# Patient Record
Sex: Male | Born: 1990 | Race: Black or African American | Hispanic: No | Marital: Single | Smoking: Current every day smoker
Health system: Southern US, Community
[De-identification: ages and names within clinical notes are randomized; demographics above are authoritative.]

## PROBLEM LIST (undated history)

## (undated) DIAGNOSIS — F419 Anxiety disorder, unspecified: Secondary | ICD-10-CM

## (undated) HISTORY — DX: Anxiety disorder, unspecified: F41.9

---
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
## 2017-11-18 ENCOUNTER — Emergency Department (HOSPITAL_COMMUNITY)
Admission: EM | Admit: 2017-11-18 | Discharge: 2017-11-18 | Disposition: A | Discharge status: Home / Self Care | Payer: Self-pay | Attending: Emergency Medicine | Admitting: Emergency Medicine

## 2017-11-18 ENCOUNTER — Emergency Department (HOSPITAL_COMMUNITY): Payer: Self-pay

## 2017-11-18 ENCOUNTER — Encounter (HOSPITAL_COMMUNITY): Payer: Self-pay | Admitting: Emergency Medicine

## 2017-11-18 ENCOUNTER — Other Ambulatory Visit: Payer: Self-pay

## 2017-11-18 DIAGNOSIS — Y929 Unspecified place or not applicable: Secondary | ICD-10-CM | POA: Insufficient documentation

## 2017-11-18 DIAGNOSIS — Y9301 Activity, walking, marching and hiking: Secondary | ICD-10-CM | POA: Insufficient documentation

## 2017-11-18 DIAGNOSIS — S41111A Laceration without foreign body of right upper arm, initial encounter: Secondary | ICD-10-CM | POA: Insufficient documentation

## 2017-11-18 DIAGNOSIS — W228XXA Striking against or struck by other objects, initial encounter: Secondary | ICD-10-CM | POA: Insufficient documentation

## 2017-11-18 DIAGNOSIS — Y999 Unspecified external cause status: Secondary | ICD-10-CM | POA: Insufficient documentation

## 2017-11-18 MED ORDER — LIDOCAINE-EPINEPHRINE (PF) 2 %-1:200000 IJ SOLN
10.0000 mL | Freq: Once | INTRAMUSCULAR | Status: AC
  Administered 2017-11-18: 10 mL via INTRADERMAL
  Filled 2017-11-18: qty 20

## 2017-11-18 NOTE — ED Provider Notes (Signed)
[facility] EMERGENCY DEPARTMENT Provider Note   CSN: [id number] Arrival date & time: 11/18/17  2022     History   Chief Complaint Chief Complaint  Patient presents with  . Extremity Laceration    HPI Ivan Macias is a 27 y.o. male.  HPI  Patient is a 27 year old male who presents the ED today complaining of an laceration to the right antecubital area of his right upper extremity that occurred prior to arrival.  Patient states that he was walking out of a doorway prior to arrival he tripped and hit his arm on the door handle which caused a laceration to his arm. States that the door handle punctured through his arm. He reports constant severe pain to this area.  Worse with movement.  Denies any numbness or weakness to the hand or arm.  Denies any other injuries with the fall.  States that he had a tetanus shot last year.  History reviewed. No pertinent past medical history.  Patient Active Problem List   Diagnosis Date Noted  . UNSPECIFIED SITE OF ANKLE SPRAIN AND STRAIN 05/16/2010  . 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 No pertinent surgical history.      Home Medications    Prior to Admission medications   Not on File    Family History No family history on file.  Social History Social History   Tobacco Use  . Smoking status: Never Smoker  . Smokeless tobacco: Never Used  Substance Use Topics  . Alcohol use: Yes  . Drug use: Never     Allergies   Patient has no allergy information on record.   Review of Systems Review of Systems  Constitutional: Negative for fever.  Musculoskeletal:       Right arm pain  Skin: Positive for wound.  Neurological: Negative for weakness, numbness and headaches.     Physical Exam Updated Vital Signs BP (!) 127/93 (BP Location: Left Arm)   Pulse 75   Temp 98.1 F (36.7 C) (Oral)   Resp 16   SpO2 93%   Physical Exam  Constitutional: He is oriented to person, place, and time. He  appears well-developed and well-nourished. No distress.  Eyes: Conjunctivae are normal.  Cardiovascular: Normal rate, regular rhythm and normal heart sounds.  Pulmonary/Chest: Effort normal and breath sounds normal.  Abdominal: Soft. Bowel sounds are normal. There is no tenderness.  Musculoskeletal:  2.5-3cm linear laceration to the right antecubital with muscle exposure.  No obvious muscular or vascular injury identified.  Bleeding is controlled.  Patient is neurovascular intact distally with strong radial pulses bilaterally.  Brisk cap refill to all fingers.  He does have symmetric grip strength bilaterally is able to flex and extend elbows however does have significant pain to the right upper extremity with this movement.  Neurological: He is alert and oriented to person, place, and time.  Skin: Skin is warm and dry.     ED Treatments / Results  Labs (all labs ordered are listed, but only abnormal results are displayed) Labs Reviewed - No data to display  EKG None  Radiology Dg Elbow Complete Right  Result Date: 11/18/2017 CLINICAL DATA:  Laceration to the elbow EXAM: RIGHT ELBOW - COMPLETE 3+ VIEW COMPARISON:  None. FINDINGS: No fracture or dislocation. Gas in the soft tissues at the antecubital fossa and on the radial side of the elbow consistent with a history of laceration. No radiopaque foreign body IMPRESSION: No fracture or malalignment Electronically Signed  By: Jasmine Pang M.D.   On: 11/18/2017 21:44    Procedures .Marland KitchenLaceration Repair Date/Time: 11/18/2017 11:20 PM Performed by: Karrie Meres, PA-C Authorized by: Karrie Meres, PA-C   Consent:    Consent obtained:  Verbal   Consent given by:  Patient   Risks discussed:  Infection and pain   Alternatives discussed:  No treatment Anesthesia (see MAR for exact dosages):    Anesthesia method:  Local infiltration   Local anesthetic:  Lidocaine 2% WITH epi Laceration details:    Location: right antecubital  fossa.   Length (cm):  3 Repair type:    Repair type:  Simple Pre-procedure details:    Preparation:  Patient was prepped and draped in usual sterile fashion and imaging obtained to evaluate for foreign bodies Exploration:    Hemostasis achieved with:  Epinephrine   Wound exploration: wound explored through full range of motion     Contaminated: no   Treatment:    Area cleansed with:  Saline   Amount of cleaning:  Extensive   Irrigation solution:  Sterile saline   Irrigation volume:  1L   Irrigation method:  Pressure wash   Visualized foreign bodies/material removed: no   Skin repair:    Repair method:  Sutures   Suture size:  5-0   Suture material:  Prolene Approximation:    Approximation:  Loose Post-procedure details:    Dressing:  Antibiotic ointment and non-adherent dressing   Patient tolerance of procedure:  Tolerated well, no immediate complications   (including critical care time)  Medications Ordered in ED Medications  lidocaine-EPINEPHrine (XYLOCAINE W/EPI) 2 %-1:200000 (PF) injection 10 mL (10 mLs Intradermal Given 11/18/17 2200)     Initial Impression / Assessment and Plan / ED Course  I have reviewed the triage vital signs and the nursing notes.  Pertinent labs & imaging results that were available during my care of the patient were reviewed by me and considered in my medical decision making (see chart for details).  Final Clinical Impressions(s) / ED Diagnoses   Final diagnoses:  Laceration of right upper extremity, initial encounter   Pressure irrigation performed. Wound explored and majority of the base of wound visualized in a bloodless field without evidence of foreign body.  X-ray completed of right elbow which showed no obvious bony involvement and no obvious foreign body.  Laceration occurred < 8 hours prior to repair which was well tolerated.  Tdap UTD.  Pt has no comorbidities to effect normal wound healing. Pt discharged without antibiotics.   Discussed suture home care with patient and answered questions. Pt to follow-up for wound check and suture removal in 10 days; they are to return to the ED sooner for signs of infection. Pt is hemodynamically stable with no complaints prior to dc.   ED Discharge Orders    None       Rayne Du 11/18/17 2321    Vanetta Mulders, MD 11/22/17 [phone]

## 2017-11-18 NOTE — ED Provider Notes (Signed)
Medical screening examination/treatment/procedure(s) were conducted as a shared visit with non-physician practitioner(s) and myself.  I personally evaluated the patient during the encounter.  None   Results for orders placed or performed during the hospital encounter of 17/40/81  Basic metabolic panel  Result Value Ref Range   Sodium 138 135 - 145 mEq/L   Potassium 3.7 3.5 - 5.1 mEq/L   Chloride 104 96 - 112 mEq/L   CO2 28 19 - 32 mEq/L   Glucose, Bld 74 70 - 99 mg/dL   BUN 12 6 - 23 mg/dL   Creatinine, Ser 1.42 0.4 - 1.5 mg/dL   Calcium 9.2 8.4 - 10.5 mg/dL   GFR calc non Af Amer NOT CALCULATED >60 mL/min   GFR calc Af Amer  >60 mL/min    NOT CALCULATED        The eGFR has been calculated using the MDRD equation. This calculation has not been validated in all clinical situations. eGFR's persistently <60 mL/min signify possible Chronic Kidney Disease.  CBC  Result Value Ref Range   WBC 10.4 4.5 - 13.5 K/uL   RBC 4.82 3.80 - 5.70 MIL/uL   Hemoglobin 13.4 12.0 - 16.0 g/dL   HCT 41.0 36.0 - 49.0 %   MCV 85.1 78.0 - 98.0 fL   MCH 27.8 25.0 - 34.0 pg   MCHC 32.7 31.0 - 37.0 g/dL   RDW 14.4 11.4 - 15.5 %   Platelets 260 150 - 400 K/uL  Differential  Result Value Ref Range   Neutrophils Relative % 62 43 - 71 %   Neutro Abs 6.5 1.7 - 8.0 K/uL   Lymphocytes Relative 22 (L) 24 - 48 %   Lymphs Abs 2.2 1.1 - 4.8 K/uL   Monocytes Relative 5 3 - 11 %   Monocytes Absolute 0.5 0.2 - 1.2 K/uL   Eosinophils Relative 10 (H) 0 - 5 %   Eosinophils Absolute 1.1 0.0 - 1.2 K/uL   Basophils Relative 1 0 - 1 %   Basophils Absolute 0.1 0.0 - 0.1 K/uL  D-dimer, quantitative  Result Value Ref Range   D-Dimer, Quant  0.00 - 0.48 ug/mL-FEU    0.46        AT THE INHOUSE ESTABLISHED CUTOFF VALUE OF 0.48 ug/mL FEU, THIS ASSAY HAS BEEN DOCUMENTED IN THE LITERATURE TO HAVE A SENSITIVITY AND NEGATIVE PREDICTIVE VALUE OF AT LEAST 98 TO 99%.  THE TEST RESULT SHOULD BE CORRELATED WITH AN  ASSESSMENT OF THE CLINICAL PROBABILITY OF DVT / VTE.  Cardiac markers, POC  Result Value Ref Range   Myoglobin, poc 106 12 - 200 ng/mL   CKMB, poc 4.0 1.0 - 8.0 ng/mL   Troponin i, poc <0.05 0.00 - 0.09 ng/mL   Comment             TROPONIN VALUES IN THE RANGE OF 0.00-0.09 ng/mL SHOW NO INDICATION OF MYOCARDIAL INJURY.        PERSISTENTLY INCREASED TROPONIN VALUES IN THE RANGE OF 0.10-0.24 ng/mL CAN BE SEEN IN:       -UNSTABLE ANGINA       -CONGESTIVE HEART FAILURE       -MYOCARDITIS       -CHEST TRAUMA       -ARRYHTHMIAS       -LATE PRESENTING MI       -COPD   CLINICAL FOLLOW-UP RECOMMENDED.        TROPONIN VALUES >=0.25 ng/mL INDICATE POSSIBLE MYOCARDIAL ISCHEMIA. SERIAL TESTING RECOMMENDED.   Dg  Elbow Complete Right  Result Date: 11/18/2017 CLINICAL DATA:  Laceration to the elbow EXAM: RIGHT ELBOW - COMPLETE 3+ VIEW COMPARISON:  None. FINDINGS: No fracture or dislocation. Gas in the soft tissues at the antecubital fossa and on the radial side of the elbow consistent with a history of laceration. No radiopaque foreign body IMPRESSION: No fracture or malalignment Electronically Signed   By: Donavan Foil M.D.   On: 11/18/2017 21:44     Patient with a stab wound from a door handle to his right antecubital area.  Occurred probably around 7 this evening.  Patient's tetanus up-to-date a tetanus a year ago.  Wound is about a dime shaped spherical opening at the antecubital area it is through the subcu fat there is some muscle exposure.  No significant bleeding.  Radial pulse distally is intact good finger cap refill and sensation to fingers movement fingers is all normal.  Wound can be closed either by suturing or with staples.  And then wound care.   Fredia Sorrow, MD 11/18/17 2236

## 2017-11-18 NOTE — ED Triage Notes (Signed)
Pt was leaving his mom's house, fell through the screen door and the doorknob punctured/lacerated his left arm.  Bleeding controlled. Dressing in place. Can move arm and fingers but is just painful to do so.

## 2017-11-18 NOTE — Discharge Instructions (Signed)
You may take Tylenol and ibuprofen for your pain.  Please follow the attached instructions for proper wound care at home.  Please refrain from working or lifting heavy objects for the next several days until your pain has improved.  Please follow-up in 10 days for suture removal. Please return to the emergency room immediately if you experience any new or worsening symptoms or any symptoms that indicate worsening infection such as fevers, increased redness/swelling/pain, warmth, or drainage from the affected area.

## 2019-05-15 IMAGING — CR DG ELBOW COMPLETE 3+V*R*
4 series · 4 of 4 positions shown · non-contrast
Comparison: None.

CLINICAL DATA: Laceration to the elbow

EXAM:
RIGHT ELBOW - COMPLETE 3+ VIEW

[elbow ap]
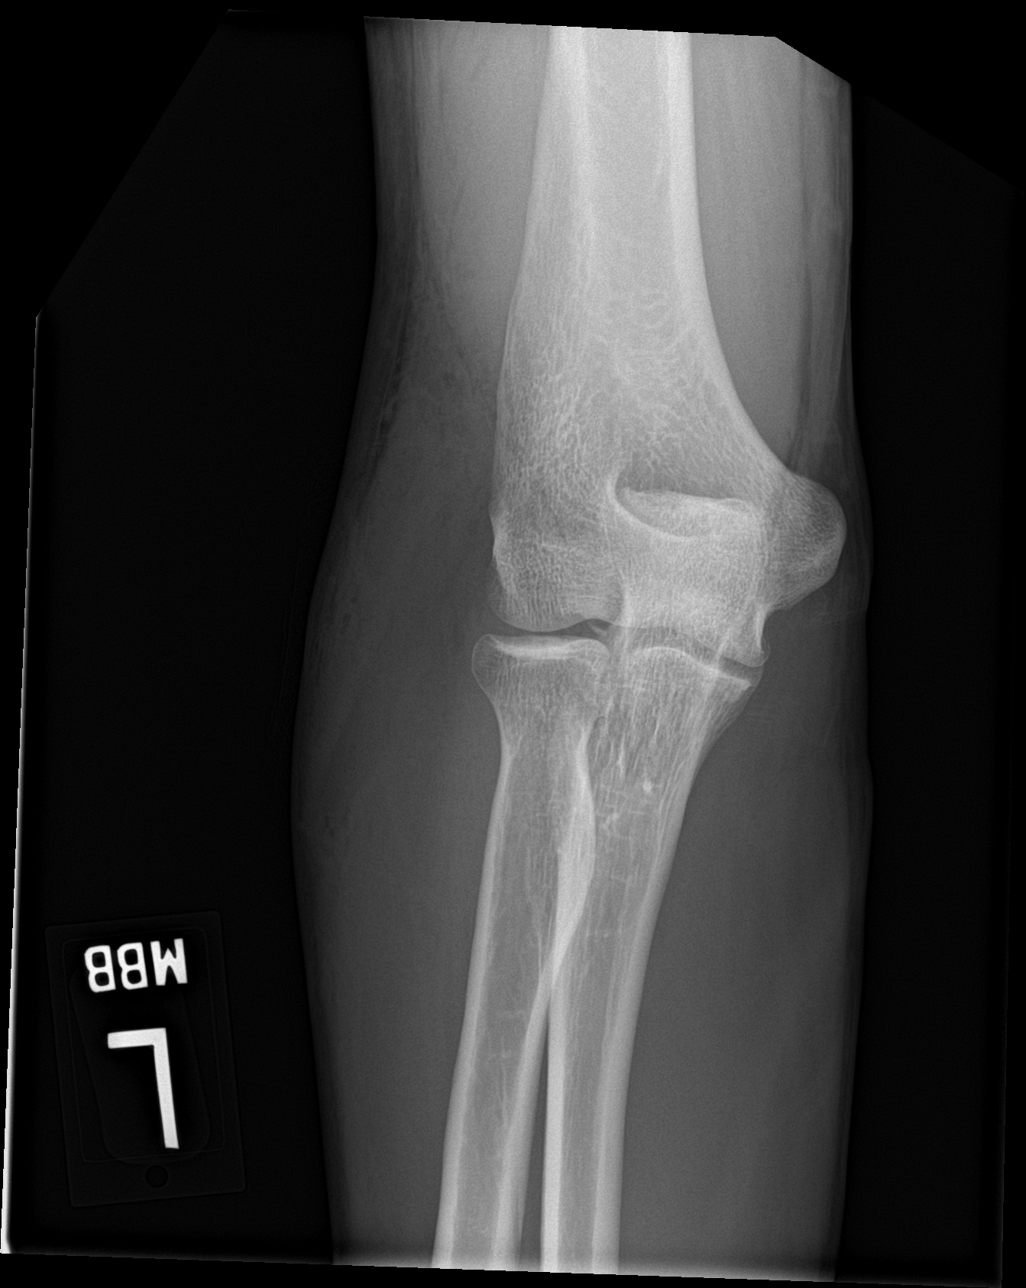

[elbow obl (1 of 2)]
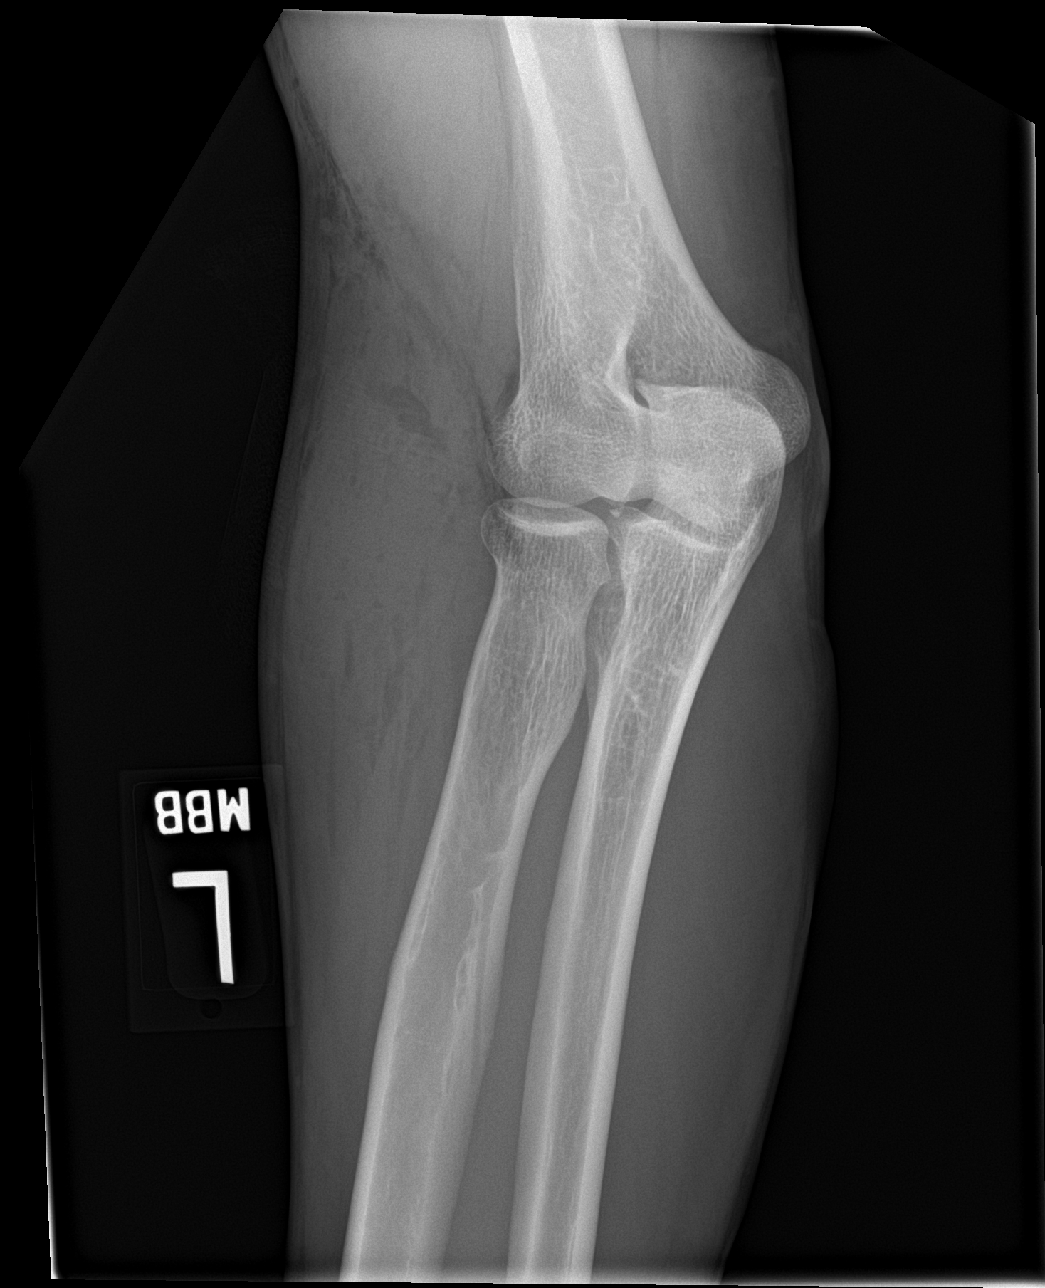

[elbow obl (2 of 2)]
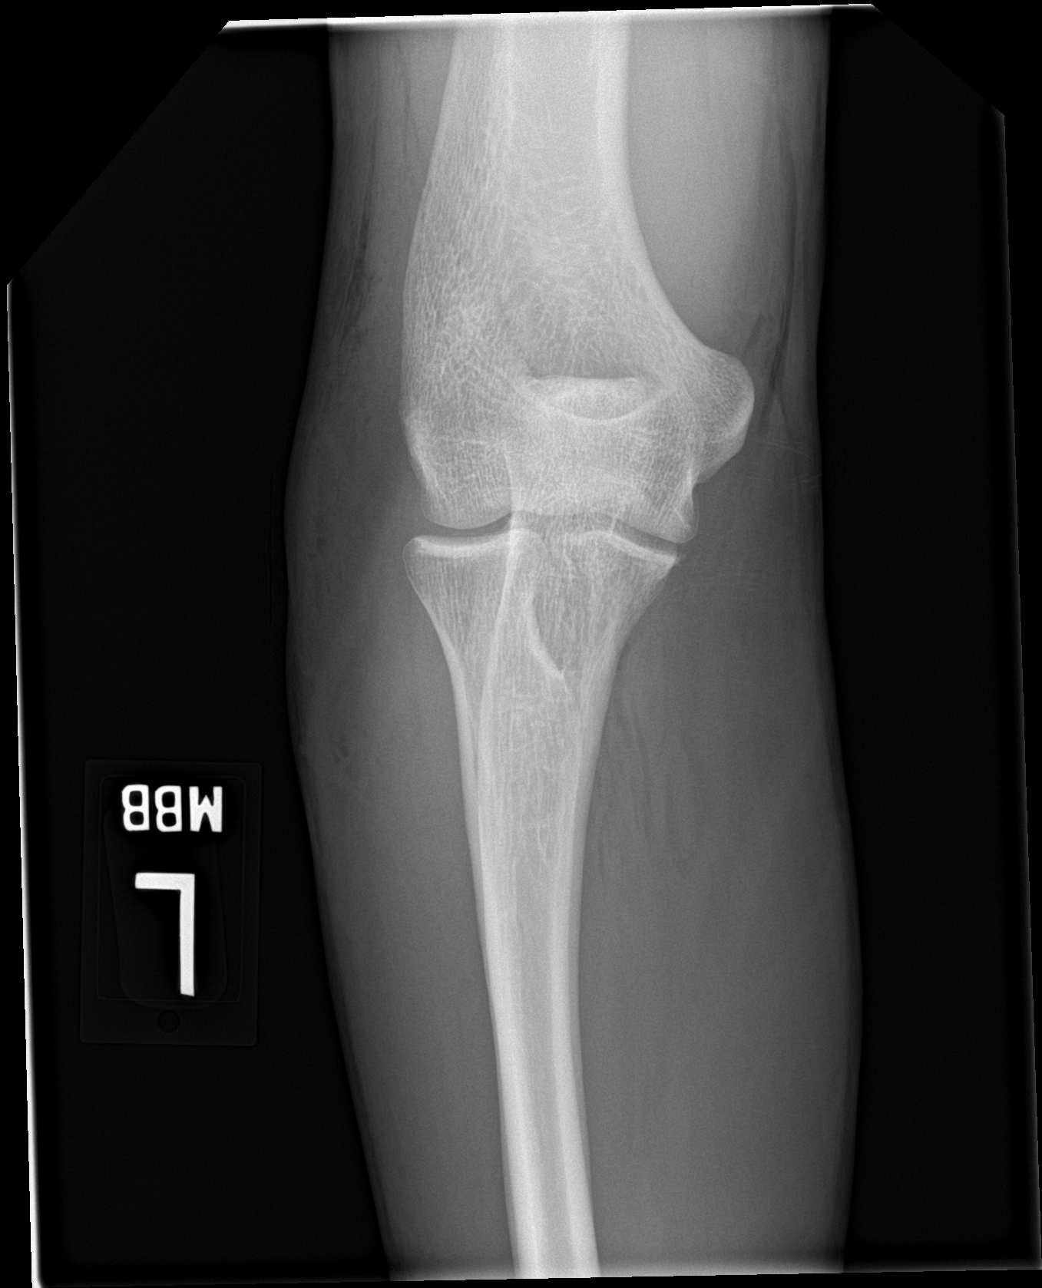

[elbow lat]
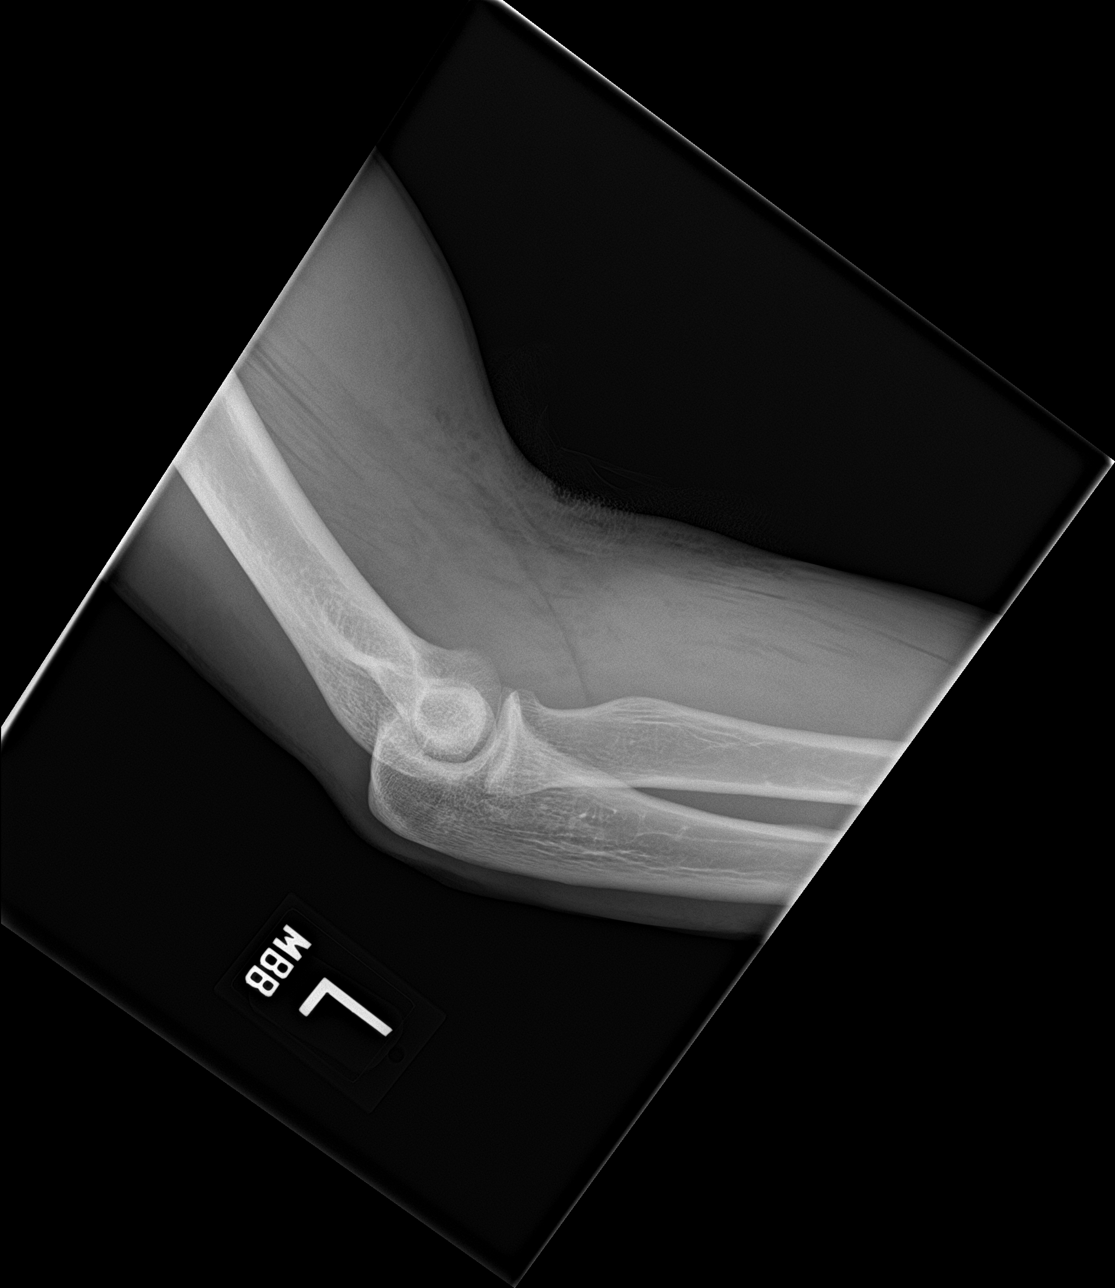

[4 of 4 positions shown; findings below may reference images not displayed]

FINDINGS: No fracture or dislocation. Gas in the soft tissues at the
antecubital fossa and on the radial side of the elbow consistent
with a history of laceration. No radiopaque foreign body
IMPRESSION: No fracture or malalignment

## 2019-12-01 DIAGNOSIS — R112 Nausea with vomiting, unspecified: Secondary | ICD-10-CM | POA: Diagnosis not present

## 2019-12-01 DIAGNOSIS — Z6824 Body mass index (BMI) 24.0-24.9, adult: Secondary | ICD-10-CM | POA: Diagnosis not present

## 2019-12-01 DIAGNOSIS — R109 Unspecified abdominal pain: Secondary | ICD-10-CM | POA: Diagnosis not present

## 2019-12-01 DIAGNOSIS — L0291 Cutaneous abscess, unspecified: Secondary | ICD-10-CM | POA: Diagnosis not present

## 2019-12-20 DIAGNOSIS — N50811 Right testicular pain: Secondary | ICD-10-CM | POA: Diagnosis not present

## 2019-12-20 DIAGNOSIS — N50812 Left testicular pain: Secondary | ICD-10-CM | POA: Diagnosis not present

## 2019-12-20 DIAGNOSIS — R1030 Lower abdominal pain, unspecified: Secondary | ICD-10-CM | POA: Diagnosis not present

## 2019-12-20 DIAGNOSIS — Z6824 Body mass index (BMI) 24.0-24.9, adult: Secondary | ICD-10-CM | POA: Diagnosis not present

## 2019-12-26 ENCOUNTER — Other Ambulatory Visit: Payer: Self-pay

## 2019-12-26 ENCOUNTER — Encounter (HOSPITAL_COMMUNITY): Payer: Self-pay

## 2019-12-26 ENCOUNTER — Emergency Department (HOSPITAL_COMMUNITY)
Admission: EM | Admit: 2019-12-26 | Discharge: 2019-12-26 | Disposition: A | Discharge status: Home / Self Care | Payer: BC Managed Care – PPO | Attending: Emergency Medicine | Admitting: Emergency Medicine

## 2019-12-26 DIAGNOSIS — R195 Other fecal abnormalities: Secondary | ICD-10-CM

## 2019-12-26 DIAGNOSIS — K5909 Other constipation: Secondary | ICD-10-CM | POA: Insufficient documentation

## 2019-12-26 DIAGNOSIS — R1013 Epigastric pain: Secondary | ICD-10-CM | POA: Insufficient documentation

## 2019-12-26 DIAGNOSIS — K5900 Constipation, unspecified: Secondary | ICD-10-CM | POA: Diagnosis not present

## 2019-12-26 DIAGNOSIS — R319 Hematuria, unspecified: Secondary | ICD-10-CM | POA: Diagnosis not present

## 2019-12-26 DIAGNOSIS — K921 Melena: Secondary | ICD-10-CM | POA: Insufficient documentation

## 2019-12-26 LAB — COMPREHENSIVE METABOLIC PANEL
ALT: 27 U/L (ref 0–44)
AST: 39 U/L (ref 15–41)
Albumin: 3.9 g/dL (ref 3.5–5.0)
Alkaline Phosphatase: 77 U/L (ref 38–126)
Anion gap: 12 (ref 5–15)
BUN: <5 mg/dL — ABNORMAL LOW (ref 6–20)
CO2: 24 mmol/L (ref 22–32)
Calcium: 9.3 mg/dL (ref 8.9–10.3)
Chloride: 103 mmol/L (ref 98–111)
Creatinine, Ser: 1.14 mg/dL (ref 0.61–1.24)
GFR calc Af Amer: >60 mL/min (ref >60)
GFR calc non Af Amer: >60 mL/min (ref >60)
Glucose, Bld: 87 mg/dL (ref 70–99)
Potassium: 3.9 mmol/L (ref 3.5–5.1)
Sodium: 139 mmol/L (ref 135–145)
Total Bilirubin: 0.8 mg/dL (ref 0.3–1.2)
Total Protein: 7.1 g/dL (ref 6.5–8.1)

## 2019-12-26 LAB — URINALYSIS, ROUTINE W REFLEX MICROSCOPIC
Bacteria, UA: NONE SEEN
Bilirubin Urine: NEGATIVE
Glucose, UA: NEGATIVE mg/dL
Ketones, ur: NEGATIVE mg/dL
Leukocytes,Ua: NEGATIVE
Nitrite: NEGATIVE
Protein, ur: NEGATIVE mg/dL
Specific Gravity, Urine: 1.021 (ref 1.005–1.030)
pH: 5 (ref 5.0–8.0)

## 2019-12-26 LAB — CBC
HCT: 44.7 % (ref 39.0–52.0)
Hemoglobin: 14.5 g/dL (ref 13.0–17.0)
MCH: 29.8 pg (ref 26.0–34.0)
MCHC: 32.4 g/dL (ref 30.0–36.0)
MCV: 92 fL (ref 80.0–100.0)
Platelets: 328 10*3/uL (ref 150–400)
RBC: 4.86 MIL/uL (ref 4.22–5.81)
RDW: 15.2 % (ref 11.5–15.5)
WBC: 13.1 10*3/uL — ABNORMAL HIGH (ref 4.0–10.5)
nRBC: 0 % (ref 0.0–0.2)

## 2019-12-26 LAB — POC OCCULT BLOOD, ED: Fecal Occult Bld: POSITIVE — AB

## 2019-12-26 LAB — LIPASE, BLOOD: Lipase: 28 U/L (ref 11–51)

## 2019-12-26 MED ORDER — DICYCLOMINE HCL 20 MG PO TABS: 20.0000 mg | ORAL_TABLET | Freq: Two times a day (BID) | ORAL | 0 refills | Status: DC

## 2019-12-26 MED ORDER — OMEPRAZOLE 20 MG PO CPDR: 20.0000 mg | DELAYED_RELEASE_CAPSULE | Freq: Every day | ORAL | 0 refills | Status: DC

## 2019-12-26 NOTE — ED Triage Notes (Signed)
Pt reports generalized abd pain for the past 2 weeks, pt went to UC when it started, given abx for a UTI in which he has finished. Pt last BM was about an hour ago but states he has had a harder time moving his bowels. Denies n/v

## 2019-12-26 NOTE — Discharge Instructions (Addendum)
Your evaluation today was reassuring.  You do have evidence of a small amount of blood in your stool which could be either from a gastric ulcer which can sometimes result from ibuprofen use.  Please stop using ibuprofen for right now and you may use Tylenol for pain 1000 g every 6 hours.  I have also prescribed you medication called Prilosec please take daily for the next 2 weeks.  He also had some blood in urine which is, as we discussed, concerning.  You should follow-up with your primary care doctor however I given you a urology consult if you are unable to see a PCP in the next several weeks.  Please call to make an appointment with a primary care doctor.  Because you have had blood in her urine for several weeks now I am recommending a urologist.  Please follow-up by calling their office for a scheduled appointment.  I also provided you with a gastroenterology referral.  For medications I am prescribing you Bentyl which can help with abdominal pain.  I am also prescribing you a "GI cleanout "which she will take that can help with constipation.      Cleanout:  Miralax cleanout 5 capfuls in 24-36 oz gatorade, drink over 4-6 hrs. If stool is not clear after 24 hours, then repeat this dose for a second day.   After Cleanout:  Give Miralax 1 capful in 8 oz juice or gatorade daily for at least 4-6 weeks.  Schedule follow up with pediatrician if no improvement in constipation in 1-2 months, sooner if not resolved after cleanout.

## 2019-12-26 NOTE — ED Provider Notes (Signed)
[facility] EMERGENCY DEPARTMENT Provider Note   CSN: [id number] Arrival date & time: 12/26/19  1106     History Chief Complaint  Patient presents with  . Abdominal Pain    Ivan Macias is a 29 y.o. male.  HPI  Patient is a 29 year old male with no pertinent past medical history presented today with epigastric abdominal pain which he states is intermittent and mild.  He states is been ongoing for approximately 2 weeks.  He also states that he feels a fullness in his lower abdomen when he poops.  He states he has been having smaller groups recently that have been small and hard.  He denies any hematochezia but does endorse some occasional darker appearing stool.  He states that he was seen 5/31 and diagnosed with a abscess and told to take ibuprofen.  He is taking that regularly for some time before his epigastric abdominal pain began.  He denies any nausea or vomiting or diarrhea.  He denies any fevers or chills.  He describes the abdominal pain as achy and worse with eating but states that it is not prevented him from eating.  He states he has a normal appetite.  He also states that he was treated for UTI 2 weeks ago however he states he had no symptoms of UTI at that time and clarifies that he was found to have some blood in his urine.    History reviewed. No pertinent past medical history.  Patient Active Problem List   Diagnosis Date Noted  . UNSPECIFIED SITE OF ANKLE SPRAIN AND STRAIN 05/16/2010  . 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 No pertinent surgical history.     No family history on file.  Social History   Tobacco Use  . Smoking status: Never Smoker  . Smokeless tobacco: Never Used  Substance Use Topics  . Alcohol use: Yes  . Drug use: Never    Home Medications Prior to Admission medications   Medication Sig Start Date End Date Taking? Authorizing Provider  dicyclomine (BENTYL) 20 MG tablet Take 1 tablet (20 mg total) by  mouth 2 (two) times daily. 12/26/19   Tedd Sias, PA  omeprazole (PRILOSEC) 20 MG capsule Take 1 capsule (20 mg total) by mouth daily for 14 days. 12/26/19 01/09/20  Tedd Sias, PA    Allergies    Patient has no known allergies.  Review of Systems   Review of Systems  Constitutional: Negative for chills and fever.  HENT: Negative for congestion.   Eyes: Negative for pain.  Respiratory: Negative for cough and shortness of breath.   Cardiovascular: Negative for chest pain and leg swelling.  Gastrointestinal: Positive for abdominal pain and constipation. Negative for nausea and vomiting.  Genitourinary: Positive for hematuria. Negative for dysuria.  Musculoskeletal: Negative for myalgias.  Skin: Negative for rash.  Neurological: Negative for dizziness and headaches.    Physical Exam Updated Vital Signs BP 116/75   Pulse (!) 53   Temp 98.3 F (36.8 C) (Oral)   Resp 18   Ht 6' (1.829 m)   Wt 83 kg   SpO2 96%   BMI 24.82 kg/m   Physical Exam Vitals and nursing note reviewed. Exam conducted with a chaperone present.  Constitutional:      General: He is not in acute distress.    Comments: Pleasant, well-appearing 29 year old gentleman in no acute distress.  Sitting up in exam bed.  HENT:     Head: Normocephalic and  atraumatic.     Nose: Nose normal.  Eyes:     General: No scleral icterus. Cardiovascular:     Rate and Rhythm: Normal rate and regular rhythm.     Pulses: Normal pulses.     Heart sounds: Normal heart sounds.  Pulmonary:     Effort: Pulmonary effort is normal. No respiratory distress.     Breath sounds: No wheezing.  Abdominal:     Palpations: Abdomen is soft.     Tenderness: There is no abdominal tenderness.     Comments: No significant abdominal tenderness.  No CVA tenderness.  No guarding or rebound.  Negative Eulah Pont, McBurney, Rovsing sign.  Genitourinary:    Rectum: Normal. Guaiac result positive.     Comments: Scant dark stool that is  nontarry on exam.  No palpable hemorrhoids or abscesses. Musculoskeletal:     Cervical back: Normal range of motion.     Right lower leg: No edema.     Left lower leg: No edema.  Skin:    General: Skin is warm and dry.     Capillary Refill: Capillary refill takes less than 2 seconds.  Neurological:     Mental Status: He is alert. Mental status is at baseline.  Psychiatric:        Mood and Affect: Mood normal.        Behavior: Behavior normal.     ED Results / Procedures / Treatments   Labs (all labs ordered are listed, but only abnormal results are displayed) Labs Reviewed  COMPREHENSIVE METABOLIC PANEL - Abnormal; Notable for the following components:      Result Value   BUN <5 (*)    All other components within normal limits  CBC - Abnormal; Notable for the following components:   WBC 13.1 (*)    All other components within normal limits  URINALYSIS, ROUTINE W REFLEX MICROSCOPIC - Abnormal; Notable for the following components:   Hgb urine dipstick SMALL (*)    All other components within normal limits  POC OCCULT BLOOD, ED - Abnormal; Notable for the following components:   Fecal Occult Bld POSITIVE (*)    All other components within normal limits  LIPASE, BLOOD  OCCULT BLOOD X 1 CARD TO LAB, STOOL    EKG None  Radiology No results found.  Procedures Procedures (including critical care time)  Medications Ordered in ED Medications - No data to display  ED Course  I have reviewed the triage vital signs and the nursing notes.  Pertinent labs & imaging results that were available during my care of the patient were reviewed by me and considered in my medical decision making (see chart for details).    MDM Rules/Calculators/A&P                          Patient is well-appearing 29 year old male with no pertinent past medical history presented today with abdominal pain, complaints of hard pieces of stool and abdominal fullness with defecation.  Patient is  well-appearing physical exam has vital signs within normal limits and has no tenderness with palpation of the abdomen.   Given patient's abnormal stools I have moderate concern for constipation versus colon cancer which I have a low suspicion for as patient denies any family history.  He does state that he has some epigastric pain and has noted some darker stools and has a positive guaiac which makes me more suspicious for peptic ulcer.  Will treat empirically with  Prilosec and have patient follow-up with gastroenterology.  Also provided the incident with Bentyl as he has had some abdominal cramps and also provided patient with instructions for GI cleanout.  He will use MiraLAX for this.  Patient has CBC with very mild leukocytosis at 13.1 he has no infectious symptoms no fevers, nausea, vomiting.  Lipase is within normal limits doubt pancreatitis and CMP is without any electrolyte abnormalities urinalysis has some hematuria.  He states he has had this in the past and been treated for UTI although he had no other symptoms of this.  I discussed with patient my concern that he most likely has some constipation perhaps a stomach ulcer and certainly needs PCP follow-up.  He is not currently have a PCP is unsure when he will be able to be seen might want however he states he has insurance.  I recommend following up with PCP however I gave him a urologist referral for his hematuria and gastroenterologist for his positive fecal occult stool and epigastric pain as well as appear treatment for stomach ulcer.  He understands plan at discharge and will follow up with his specialists as well as a PCP.  Final Clinical Impression(s) / ED Diagnoses Final diagnoses:  Epigastric pain  Positive occult stool blood test  Hematuria, unspecified type  Other constipation    Rx / DC Orders ED Discharge Orders         Ordered    omeprazole (PRILOSEC) 20 MG capsule  Daily     Discontinue  Reprint     12/26/19 1930     dicyclomine (BENTYL) 20 MG tablet  2 times daily     Discontinue  Reprint     12/26/19 1930           Gailen Shelter, [place] 12/26/19 2228    Benjiman Core, MD 12/26/19 2318

## 2020-01-16 ENCOUNTER — Encounter: Payer: Self-pay | Admitting: Gastroenterology

## 2020-01-20 DIAGNOSIS — N50812 Left testicular pain: Secondary | ICD-10-CM | POA: Diagnosis not present

## 2020-01-27 DIAGNOSIS — I861 Scrotal varices: Secondary | ICD-10-CM | POA: Diagnosis not present

## 2020-01-27 DIAGNOSIS — N50812 Left testicular pain: Secondary | ICD-10-CM | POA: Diagnosis not present

## 2020-01-28 DIAGNOSIS — R202 Paresthesia of skin: Secondary | ICD-10-CM | POA: Diagnosis not present

## 2020-01-28 DIAGNOSIS — M79602 Pain in left arm: Secondary | ICD-10-CM | POA: Diagnosis not present

## 2020-01-28 DIAGNOSIS — M79601 Pain in right arm: Secondary | ICD-10-CM | POA: Diagnosis not present

## 2020-01-28 DIAGNOSIS — N3000 Acute cystitis without hematuria: Secondary | ICD-10-CM | POA: Diagnosis not present

## 2020-01-28 DIAGNOSIS — R200 Anesthesia of skin: Secondary | ICD-10-CM | POA: Diagnosis not present

## 2020-02-12 DIAGNOSIS — N451 Epididymitis: Secondary | ICD-10-CM | POA: Diagnosis not present

## 2020-02-12 DIAGNOSIS — R8279 Other abnormal findings on microbiological examination of urine: Secondary | ICD-10-CM | POA: Diagnosis not present

## 2020-03-17 ENCOUNTER — Ambulatory Visit: Payer: BC Managed Care – PPO | Admitting: Gastroenterology

## 2020-03-17 DIAGNOSIS — R8279 Other abnormal findings on microbiological examination of urine: Secondary | ICD-10-CM | POA: Diagnosis not present

## 2020-03-17 DIAGNOSIS — N50812 Left testicular pain: Secondary | ICD-10-CM | POA: Diagnosis not present

## 2020-04-16 DIAGNOSIS — R3121 Asymptomatic microscopic hematuria: Secondary | ICD-10-CM | POA: Diagnosis not present

## 2020-04-16 DIAGNOSIS — N50811 Right testicular pain: Secondary | ICD-10-CM | POA: Diagnosis not present

## 2020-04-16 DIAGNOSIS — N50812 Left testicular pain: Secondary | ICD-10-CM | POA: Diagnosis not present

## 2020-04-21 DIAGNOSIS — R531 Weakness: Secondary | ICD-10-CM | POA: Diagnosis not present

## 2020-04-21 DIAGNOSIS — M5416 Radiculopathy, lumbar region: Secondary | ICD-10-CM | POA: Diagnosis not present

## 2020-04-21 DIAGNOSIS — M545 Low back pain, unspecified: Secondary | ICD-10-CM | POA: Diagnosis not present

## 2020-04-21 DIAGNOSIS — F17210 Nicotine dependence, cigarettes, uncomplicated: Secondary | ICD-10-CM | POA: Diagnosis not present

## 2020-04-21 DIAGNOSIS — M47817 Spondylosis without myelopathy or radiculopathy, lumbosacral region: Secondary | ICD-10-CM | POA: Diagnosis not present

## 2020-04-21 DIAGNOSIS — M79601 Pain in right arm: Secondary | ICD-10-CM | POA: Diagnosis not present

## 2020-04-26 DIAGNOSIS — M549 Dorsalgia, unspecified: Secondary | ICD-10-CM | POA: Diagnosis not present

## 2020-04-30 DIAGNOSIS — R3121 Asymptomatic microscopic hematuria: Secondary | ICD-10-CM | POA: Diagnosis not present

## 2020-05-07 DIAGNOSIS — R3121 Asymptomatic microscopic hematuria: Secondary | ICD-10-CM | POA: Diagnosis not present

## 2020-05-07 DIAGNOSIS — N50812 Left testicular pain: Secondary | ICD-10-CM | POA: Diagnosis not present

## 2020-05-13 ENCOUNTER — Encounter (HOSPITAL_COMMUNITY): Payer: Self-pay | Admitting: Emergency Medicine

## 2020-05-13 ENCOUNTER — Other Ambulatory Visit: Payer: Self-pay

## 2020-05-13 ENCOUNTER — Emergency Department (HOSPITAL_COMMUNITY): Payer: BC Managed Care – PPO

## 2020-05-13 ENCOUNTER — Emergency Department (HOSPITAL_COMMUNITY)
Admission: EM | Admit: 2020-05-13 | Discharge: 2020-05-14 | Disposition: A | Discharge status: Home / Self Care | Payer: BC Managed Care – PPO | Attending: Emergency Medicine | Admitting: Emergency Medicine

## 2020-05-13 DIAGNOSIS — R072 Precordial pain: Secondary | ICD-10-CM | POA: Insufficient documentation

## 2020-05-13 DIAGNOSIS — R1310 Dysphagia, unspecified: Secondary | ICD-10-CM | POA: Diagnosis not present

## 2020-05-13 DIAGNOSIS — R0602 Shortness of breath: Secondary | ICD-10-CM | POA: Diagnosis not present

## 2020-05-13 DIAGNOSIS — R079 Chest pain, unspecified: Secondary | ICD-10-CM | POA: Diagnosis not present

## 2020-05-13 DIAGNOSIS — T7840XA Allergy, unspecified, initial encounter: Secondary | ICD-10-CM | POA: Insufficient documentation

## 2020-05-13 DIAGNOSIS — F17210 Nicotine dependence, cigarettes, uncomplicated: Secondary | ICD-10-CM | POA: Diagnosis not present

## 2020-05-13 LAB — CBC
HCT: 43.6 % (ref 39.0–52.0)
Hemoglobin: 14.3 g/dL (ref 13.0–17.0)
MCH: 29.1 pg (ref 26.0–34.0)
MCHC: 32.8 g/dL (ref 30.0–36.0)
MCV: 88.6 fL (ref 80.0–100.0)
Platelets: 231 10*3/uL (ref 150–400)
RBC: 4.92 MIL/uL (ref 4.22–5.81)
RDW: 13.1 % (ref 11.5–15.5)
WBC: 14.8 10*3/uL — ABNORMAL HIGH (ref 4.0–10.5)
nRBC: 0 % (ref 0.0–0.2)

## 2020-05-13 MED ORDER — HYDROCODONE-ACETAMINOPHEN 5-325 MG PO TABS
1.0000 | ORAL_TABLET | Freq: Once | ORAL | Status: AC
  Administered 2020-05-14: 1 via ORAL
  Filled 2020-05-13: qty 1

## 2020-05-13 NOTE — ED Triage Notes (Signed)
Per [facility] pt reports he smoked marijuana after leaving work; he then began having right-sided chest wall pain and reports pain all over his body and states he is unable to walk

## 2020-05-14 LAB — RAPID URINE DRUG SCREEN, HOSP PERFORMED
Amphetamines: NOT DETECTED
Barbiturates: NOT DETECTED
Benzodiazepines: NOT DETECTED
Cocaine: NOT DETECTED
Opiates: NOT DETECTED
Tetrahydrocannabinol: POSITIVE — AB

## 2020-05-14 LAB — BASIC METABOLIC PANEL
Anion gap: 11 (ref 5–15)
BUN: 15 mg/dL (ref 6–20)
CO2: 23 mmol/L (ref 22–32)
Calcium: 8.9 mg/dL (ref 8.9–10.3)
Chloride: 99 mmol/L (ref 98–111)
Creatinine, Ser: 1.24 mg/dL (ref 0.61–1.24)
GFR, Estimated: >60 mL/min (ref >60)
Glucose, Bld: 96 mg/dL (ref 70–99)
Potassium: 3.4 mmol/L — ABNORMAL LOW (ref 3.5–5.1)
Sodium: 133 mmol/L — ABNORMAL LOW (ref 135–145)

## 2020-05-14 LAB — CK: Total CK: 489 U/L — ABNORMAL HIGH (ref 49–397)

## 2020-05-14 LAB — TROPONIN I (HIGH SENSITIVITY)
Troponin I (High Sensitivity): 16 ng/L (ref <18)
Troponin I (High Sensitivity): 16 ng/L (ref <18)

## 2020-05-14 NOTE — ED Provider Notes (Signed)
[facility] EMERGENCY DEPARTMENT Provider Note   CSN: [id number] Arrival date & time: 05/13/20  2028     History Chief Complaint  Patient presents with  . Chest Pain    Ivan Macias is a 29 y.o. male.  The history is provided by the patient.  Chest Pain Pain location:  Substernal area Pain quality: pressure   Pain radiates to:  Does not radiate Pain severity:  Severe Onset quality:  Sudden Duration:  4 hours Timing:  Constant Progression:  Improving Chronicity:  New Context: drug use   Relieved by:  None tried Worsened by:  Nothing Associated symptoms: dysphagia and shortness of breath   Associated symptoms: no fever and no vomiting   Patient reports after work he smoked a marijuana joint.  Approximately 15 minutes later he began having chest pressure and tightness with associated shortness of breath.  He also reports he felt that his throat was closing off.  No rash.  No facial swelling.  The chest symptoms are improving, but still feels some difficulty swallowing. He has never had this before.  He does not think the marijuana was laced with any other drugs.  He does report he just started meloxicam for back pain. No previous history of CAD/VTE He does report diffuse myalgias.      Patient Active Problem List   Diagnosis Date Noted  . UNSPECIFIED SITE OF ANKLE SPRAIN AND STRAIN 05/16/2010  . 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 No pertinent surgical history.     History reviewed. No pertinent family history.  Social History   Tobacco Use  . Smoking status: Current Every Day Smoker    Packs/day: 0.50    Years: 10.00    Pack years: 5.00    Types: Cigarettes  . Smokeless tobacco: Never Used  Vaping Use  . Vaping Use: Former  Substance Use Topics  . Alcohol use: Yes    Alcohol/week: 2.0 - 3.0 standard drinks    Types: 2 - 3 Cans of beer per week    Comment: daily  . Drug use: Yes    Types: Marijuana    Home Medications Prior to  Admission medications   Medication Sig Start Date End Date Taking? Authorizing Provider  dicyclomine (BENTYL) 20 MG tablet Take 1 tablet (20 mg total) by mouth 2 (two) times daily. 12/26/19 05/14/20  Gailen Shelter, PA  omeprazole (PRILOSEC) 20 MG capsule Take 1 capsule (20 mg total) by mouth daily for 14 days. 12/26/19 05/14/20  Gailen Shelter, PA    Allergies    Patient has no known allergies.  Review of Systems   Review of Systems  Constitutional: Negative for fever.  HENT: Positive for trouble swallowing.   Respiratory: Positive for shortness of breath.   Cardiovascular: Positive for chest pain.  Gastrointestinal: Negative for vomiting.  Musculoskeletal: Positive for myalgias.  All other systems reviewed and are negative.   Physical Exam Updated Vital Signs BP 124/79   Pulse (!) 59   Temp 98.3 F (36.8 C) (Oral)   Resp 15   Ht 1.829 m (6')   Wt 79.4 kg   SpO2 95%   BMI 23.73 kg/m   Physical Exam CONSTITUTIONAL: Well developed/well nourished HEAD: Normocephalic/atraumatic EYES: EOMI/PERRL ENMT: Mucous membranes moist, no angioedema, uvula midline without erythema or exudates.  No edema to posterior oropharynx.  No stridor.  No dysphonia.  No drooling NECK: supple no meningeal signs SPINE/BACK:entire spine nontender CV: S1/S2 noted, no murmurs/rubs/gallops noted LUNGS:  Lungs are clear to auscultation bilaterally, no apparent distress ABDOMEN: soft, nontender, no rebound or guarding, bowel sounds noted throughout abdomen GU:no cva tenderness NEURO: Pt is awake/alert/appropriate, moves all extremitiesx4.  No facial droop.   EXTREMITIES: pulses normal/equalx4, full ROM, no calf tenderness or edema SKIN: warm, color normal, no rash PSYCH: no abnormalities of mood noted, alert and oriented to situation  ED Results / Procedures / Treatments   Labs (all labs ordered are listed, but only abnormal results are displayed) Labs Reviewed  BASIC METABOLIC PANEL - Abnormal;  Notable for the following components:      Result Value   Sodium 133 (*)    Potassium 3.4 (*)    All other components within normal limits  CBC - Abnormal; Notable for the following components:   WBC 14.8 (*)    All other components within normal limits  CK - Abnormal; Notable for the following components:   Total CK 489 (*)    All other components within normal limits  RAPID URINE DRUG SCREEN, HOSP PERFORMED - Abnormal; Notable for the following components:   Tetrahydrocannabinol POSITIVE (*)    All other components within normal limits  TROPONIN I (HIGH SENSITIVITY)  TROPONIN I (HIGH SENSITIVITY)    EKG EKG Interpretation  Date/Time:  Thursday May 13 2020 20:39:27 EST Ventricular Rate:  116 PR Interval:  160 QRS Duration: 92 QT Interval:  328 QTC Calculation: 455 R Axis:   76 Text Interpretation: Sinus tachycardia Otherwise normal ECG No STEMI Confirmed by Alona Bene [phone]) on 05/13/2020 9:14:00 PM   Radiology DG Chest 2 View  Result Date: 05/13/2020 CLINICAL DATA:  Chest pain EXAM: CHEST - 2 VIEW COMPARISON:  02/17/2010 FINDINGS: The heart size and mediastinal contours are within normal limits. Both lungs are clear. The visualized skeletal structures are unremarkable. IMPRESSION: No active cardiopulmonary disease. Electronically Signed   By: Helyn Numbers MD   On: 05/13/2020 21:03    Procedures Procedures   Medications Ordered in ED Medications  HYDROcodone-acetaminophen (NORCO/VICODIN) 5-325 MG per tablet 1 tablet (1 tablet Oral Given 05/14/20 0007)    ED Course  I have reviewed the triage vital signs and the nursing notes.  Pertinent labs & imaging results that were available during my care of the patient were reviewed by me and considered in my medical decision making (see chart for details).    MDM Rules/Calculators/A&P                          12:23 AM Patient presents with chest pain and difficulty breathing swallowing that began soon after  smoking marijuana.  Patient also reports he to start meloxicam for back pain. He was advised to stop both the medication as well as marijuana. He has no signs of anaphylaxis at this time, however symptoms do sound somewhat related to allergy. Will monitor in the ED and reassess 1:08 AM Patient resting comfortably.  No distress.  No new complaints.  Reports pain is improving.  No signs of rhabdomyolysis on labs 2:52 AM Patient reports improvement.  No acute distress.  Vitals are appropriate BP 124/80   Pulse 62   Temp 98.3 F (36.8 C) (Oral)   Resp 18   Ht 1.829 m (6')   Wt 79.4 kg   SpO2 97%   BMI 23.73 kg/m  No signs of any systemic allergic reaction.  No angioedema, no oral swelling. He reports chest symptoms have improved. Still unclear exactly what happened,  but still fever likely reaction to medication or marijuana.  He was advised to stop both.  He has used ibuprofen in the past without any difficulty, I did encourage him to use that for a short period of time.  We discussed strict ER return precautions  Low suspicion for ACS/PE/dissection at this time.  Final Clinical Impression(s) / ED Diagnoses Final diagnoses:  Precordial pain  Allergic reaction, initial encounter    Rx / DC Orders ED Discharge Orders    None       Zadie Rhine, MD 05/14/20 [phone]

## 2020-05-14 NOTE — Discharge Instructions (Addendum)
PLEASE STOP TAKING THE MELOXICAM FOR NOW   Your caregiver has diagnosed you as having chest pain that is not specific for one problem, but does not require admission.  Chest pain comes from many different causes.  SEEK IMMEDIATE MEDICAL ATTENTION IF: You have severe chest pain, especially if the pain is crushing or pressure-like and spreads to the arms, back, neck, or jaw, or if you have sweating, nausea (feeling sick to your stomach), or shortness of breath. THIS IS AN EMERGENCY. Don't wait to see if the pain will go away. Get medical help at once. Call 911 or 0 (operator). DO NOT drive yourself to the hospital.  Your chest pain gets worse and does not go away with rest.  You have an attack of chest pain lasting longer than usual, despite rest and treatment with the medications your caregiver has prescribed.  You wake from sleep with chest pain or shortness of breath.  You feel dizzy or faint.  You have chest pain not typical of your usual pain for which you originally saw your caregiver.

## 2020-05-18 ENCOUNTER — Ambulatory Visit: Payer: BC Managed Care – PPO | Admitting: Gastroenterology

## 2020-05-18 ENCOUNTER — Encounter: Payer: Self-pay | Admitting: Gastroenterology

## 2020-05-18 VITALS — BP 124/90 | HR 82 | Ht 72.0 in | Wt 175.0 lb

## 2020-05-18 DIAGNOSIS — R195 Other fecal abnormalities: Secondary | ICD-10-CM | POA: Diagnosis not present

## 2020-05-18 DIAGNOSIS — R1013 Epigastric pain: Secondary | ICD-10-CM

## 2020-05-18 NOTE — Patient Instructions (Signed)
If you are age 28 or older, your body mass index should be between 23-30. Your Body mass index is 23.73 kg/m. If this is out of the aforementioned range listed, please consider follow up with your Primary Care Provider.  If you are age 36 or younger, your body mass index should be between 19-25. Your Body mass index is 23.73 kg/m. If this is out of the aformentioned range listed, please consider follow up with your Primary Care Provider.   Follow up as needed.   It was a pleasure to see you today!  Dr. Myrtie Neither

## 2020-05-18 NOTE — Progress Notes (Signed)
[facility] Consult Note:  History: Ivan Macias 05/18/2020  Referring provider: [facility] health ED  Reason for consult/chief complaint: Blood In Stools (Positive hemocult) and Abdominal Pain (Severe left and right sided pain, recent ER visit)   Subjective  HPI:  [name] was referred to [place] after a [place] visit June 25 where he presented for several days of abdominal pain that was primarily epigastric but somewhat lower as well.  He had constipation for several days and says he had been drinking more than usual while on vacation at the beach.  ED provider note and work-up reviewed, he was found to be heme positive on rectal exam.  He tells me that he was drinking several beers a day and often skipping meals so his bowel habits were regular.  He started eating more regularly and generally taking better care of himself in the last couple of months.  He cut down his alcohol after the ED visit and then stopped altogether about a week ago.  That has all led to him feeling much better, and he has no residual abdominal pain.  He had never seen overt GI bleeding with black or bloody stool. [name] was having chronic testicular pain for which he saw urology, and tells me he had an office exam and then some scans with nothing found.  He was put on meloxicam and gabapentin.  Shortly after starting the meloxicam he was in the ED last week for an apparent allergic reaction.  He also had been using marijuana regularly including just before the reaction occurred, but did not think that was the cause.  [name]'s bowel habits are regular these days, he has no abdominal pain, his appetite is good he is eating three meals a day, working full-time and generally feeling well.  ROS:  Review of Systems  Constitutional: Negative for appetite change and unexpected weight change.  HENT: Negative for mouth sores and voice change.   Eyes: Negative for pain and redness.  Respiratory: Negative for  cough and shortness of breath.   Cardiovascular: Negative for chest pain and palpitations.  Genitourinary: Positive for testicular pain. Negative for dysuria and hematuria.  Musculoskeletal: Negative for arthralgias and myalgias.  Skin: Negative for pallor and rash.  Neurological: Negative for weakness and headaches.  Hematological: Negative for adenopathy.     Past Medical History: No past medical history on file.   Past Surgical History: No past surgical history on file.   Family History: No family history on file.  Social History: Social History   Socioeconomic History  . Marital status: Single    Spouse name: Not on file  . Number of children: Not on file  . Years of education: Not on file  . Highest education level: Not on file  Occupational History  . Not on file  Tobacco Use  . Smoking status: Current Every Day Smoker    Packs/day: 0.50    Years: 10.00    Pack years: 5.00    Types: Cigarettes  . Smokeless tobacco: Never Used  Vaping Use  . Vaping Use: Former  Substance and Sexual Activity  . Alcohol use: Yes    Alcohol/week: 2.0 - 3.0 standard drinks    Types: 2 - 3 Cans of beer per week    Comment: daily  . Drug use: Yes    Types: Marijuana  . Sexual activity: Yes  Other Topics Concern  . Not on file  Social History Narrative  . Not on file  Social Determinants of Health   Financial Resource Strain:   . Difficulty of Paying Living Expenses: Not on file  Food Insecurity:   . Worried About Programme researcher, broadcasting/film/video in the Last Year: Not on file  . Ran Out of Food in the Last Year: Not on file  Transportation Needs:   . Lack of Transportation (Medical): Not on file  . Lack of Transportation (Non-Medical): Not on file  Physical Activity:   . Days of Exercise per Week: Not on file  . Minutes of Exercise per Session: Not on file  Stress:   . Feeling of Stress : Not on file  Social Connections:   . Frequency of Communication with Friends and Family:  Not on file  . Frequency of Social Gatherings with Friends and Family: Not on file  . Attends Religious Services: Not on file  . Active Member of Clubs or Organizations: Not on file  . Attends Banker Meetings: Not on file  . Marital Status: Not on file    Allergies: Allergies  Allergen Reactions  . Meloxicam Anaphylaxis    Outpatient Meds: Current Outpatient Medications  Medication Sig Dispense Refill  . gabapentin (NEURONTIN) 100 MG capsule Take 2 capsules by mouth in the morning and at bedtime.     No current facility-administered medications for this visit.      ___________________________________________________________________ Objective   Exam:  BP 124/90   Pulse 82   Ht 6' (1.829 m)   Wt 175 lb (79.4 kg)   BMI 23.73 kg/m    General: Well-appearing  Eyes: sclera anicteric, no redness  ENT: oral mucosa moist without lesions, no cervical or supraclavicular lymphadenopathy  CV: RRR without murmur, S1/S2, no JVD, no peripheral edema  Resp: clear to auscultation bilaterally, normal RR and effort noted  GI: soft, no tenderness, with active bowel sounds. No guarding or palpable organomegaly noted.  Skin; warm and dry, no rash or jaundice noted  Neuro: awake, alert and oriented x 3. Normal gross motor function and fluent speech Rectal: Normal perianal exam.  Normal DRE, scant soft light brown stool in rectal vault, heme-negative.  Labs:  CBC Latest Ref Rng & Units 05/13/2020 12/26/2019 02/17/2010  WBC 4.0 - 10.5 K/uL 14.8(H) 13.1(H) 10.4  Hemoglobin 13.0 - 17.0 g/dL 78.2 95.6 21.3  Hematocrit 39 - 52 % 43.6 44.7 41.0  Platelets 150 - 400 K/uL 231 328 260   CMP Latest Ref Rng & Units 05/13/2020 12/26/2019 02/17/2010  Glucose 70 - 99 mg/dL 96 87 74  BUN 6 - 20 mg/dL 15 <0(Q) 12  Creatinine 0.61 - 1.24 mg/dL 6.57 8.46 9.62  Sodium 135 - 145 mmol/L 133(L) 139 138  Potassium 3.5 - 5.1 mmol/L 3.4(L) 3.9 3.7  Chloride 98 - 111 mmol/L 99 103 104    CO2 22 - 32 mmol/L 23 24 28   Calcium 8.9 - 10.3 mg/dL 8.9 9.3 9.2  Total Protein 6.5 - 8.1 g/dL - 7.1 -  Total Bilirubin 0.3 - 1.2 mg/dL - 0.8 -  Alkaline Phos 38 - 126 U/L - 77 -  AST 15 - 41 U/L - 39 -  ALT 0 - 44 U/L - 27 -     Radiologic Studies:  No abdominal imaging  Normal chest x-ray during last week's ED visit  Assessment: Encounter Diagnoses  Name Primary?  . Abdominal pain, epigastric Yes  . Heme positive stool     Epigastric pain with some lower abdominal pain occurring months ago prompting ED  visit, in setting of excess alcohol use.  There was some constipation at that time as well which since resolved.  He has lately been taking better care of himself and his symptoms have resolved, his digestive function has normalized.  Heme-negative on exam today.  I do not think he needs imaging or endoscopic tests at this point.  Plan:  I encouraged him to continue healthy diet and lifestyle and gave him our contact info so he could call as needed should recurrent GI problems arise.  Thank you for the courtesy of this consult.  Please call me with any questions or concerns.  Charlie Pitter III  CC: Referring provider noted above

## 2020-09-07 DIAGNOSIS — Z6825 Body mass index (BMI) 25.0-25.9, adult: Secondary | ICD-10-CM | POA: Diagnosis not present

## 2020-09-07 DIAGNOSIS — R1030 Lower abdominal pain, unspecified: Secondary | ICD-10-CM | POA: Diagnosis not present

## 2020-09-22 ENCOUNTER — Ambulatory Visit (INDEPENDENT_AMBULATORY_CARE_PROVIDER_SITE_OTHER): Payer: BC Managed Care – PPO | Admitting: Nurse Practitioner

## 2020-10-14 ENCOUNTER — Ambulatory Visit (INDEPENDENT_AMBULATORY_CARE_PROVIDER_SITE_OTHER): Payer: BC Managed Care – PPO | Admitting: Nurse Practitioner

## 2020-10-14 ENCOUNTER — Other Ambulatory Visit: Payer: Self-pay

## 2020-10-14 ENCOUNTER — Encounter (INDEPENDENT_AMBULATORY_CARE_PROVIDER_SITE_OTHER): Payer: Self-pay | Admitting: Nurse Practitioner

## 2020-10-14 VITALS — BP 128/66 | HR 86 | Temp 97.0°F | Ht 74.0 in | Wt 177.4 lb

## 2020-10-14 DIAGNOSIS — R519 Headache, unspecified: Secondary | ICD-10-CM

## 2020-10-14 DIAGNOSIS — Z0001 Encounter for general adult medical examination with abnormal findings: Secondary | ICD-10-CM | POA: Diagnosis not present

## 2020-10-14 DIAGNOSIS — Z1329 Encounter for screening for other suspected endocrine disorder: Secondary | ICD-10-CM | POA: Diagnosis not present

## 2020-10-14 DIAGNOSIS — Z13220 Encounter for screening for lipoid disorders: Secondary | ICD-10-CM | POA: Diagnosis not present

## 2020-10-14 DIAGNOSIS — Z131 Encounter for screening for diabetes mellitus: Secondary | ICD-10-CM

## 2020-10-14 DIAGNOSIS — J301 Allergic rhinitis due to pollen: Secondary | ICD-10-CM | POA: Diagnosis not present

## 2020-10-14 MED ORDER — FLUTICASONE PROPIONATE 50 MCG/ACT NA SUSP: 2.0000 | Freq: Every day | NASAL | 6 refills | Status: DC

## 2020-10-14 NOTE — Patient Instructions (Signed)
Find Allegra over the counter and start taking 1 tablet by mouth daily

## 2020-10-14 NOTE — Progress Notes (Signed)
Subjective:  Patient ID: Ivan Macias, male    DOB: 1991-01-29  Age: 30 y.o. MRN: [id number]  CC:  Chief Complaint  Patient presents with  . Allergic Rhinitis     Having head pain; sinuses.       HPI  This patient arrives today for the above.  He is here to establish care at this office.  He tells me he has not had a primary care provider in a very long time and he was encouraged by his mother to establish with a primary care provider.  So he is here today to establish.  He also mentions he is having a lot of the sinus congestion and head pain.  This is been going on for 2 days.  He denies any obvious fevers but has felt a couple of chills over the last couple of days.  He denies any coughing or shortness of breath.  He also mentions that he does get some frequent abdominal pain.  He tells me that he does have a bowel movement on a regular basis and does not report any constipation.  When asked if he seen blood in his stool he tells me he does not think so, but thinks maybe one time he saw a little bit of blood but is not sure about this.  No past medical history on file.    No family history on file.  Social History   Social History Narrative  . Not on file   Social History   Tobacco Use  . Smoking status: Current Every Day Smoker    Packs/day: 0.50    Years: 10.00    Pack years: 5.00    Types: Cigarettes  . Smokeless tobacco: Never Used  Substance Use Topics  . Alcohol use: Yes    Alcohol/week: 2.0 - 3.0 standard drinks    Types: 2 - 3 Cans of beer per week    Comment: daily     Current Meds  Medication Sig  . fexofenadine (ALLEGRA) 180 MG tablet Take 180 mg by mouth daily.  . fluticasone (FLONASE) 50 MCG/ACT nasal spray Place 2 sprays into both nostrils daily.    ROS:  Review of Systems  Constitutional: Positive for chills. Negative for fever.  HENT: Positive for congestion.   Respiratory: Negative for shortness of breath.   Cardiovascular:  Negative for chest pain.  Gastrointestinal: Positive for abdominal pain. Negative for blood in stool and melena.  Neurological: Positive for headaches.     Objective:   Today's Vitals: BP 128/66 (BP Location: Right Arm, Patient Position: Sitting, Cuff Size: Normal)   Pulse 86   Temp (!) 97 F (36.1 C) (Temporal)   Ht 6' 2"  (1.88 m)   Wt 177 lb 6.4 oz (80.5 kg)   BMI 22.78 kg/m  Vitals with BMI 10/14/2020 05/18/2020 05/14/2020  Height 6' 2"  6' 0"  -  Weight 177 lbs 6 oz 175 lbs -  BMI 85.63 14.97 -  Systolic 026 378 588  Diastolic 66 90 67  Pulse 86 82 62     Physical Exam Vitals reviewed.  Constitutional:      Appearance: Normal appearance.  HENT:     Head: Normocephalic and atraumatic.  Cardiovascular:     Rate and Rhythm: Normal rate and regular rhythm.  Pulmonary:     Effort: Pulmonary effort is normal.     Breath sounds: Normal breath sounds.  Abdominal:     General: Bowel sounds are normal.  Tenderness: There is no abdominal tenderness.  Musculoskeletal:     Cervical back: Neck supple.  Skin:    General: Skin is warm and dry.  Neurological:     Mental Status: He is alert and oriented to person, place, and time.  Psychiatric:        Mood and Affect: Mood normal.        Behavior: Behavior normal.        Thought Content: Thought content normal.        Judgment: Judgment normal.          Assessment and Plan   1. Allergic rhinitis due to pollen, unspecified seasonality   2. Screening for diabetes mellitus   3. Screening, lipid   4. Screening for thyroid disorder   5. Intractable headache, unspecified chronicity pattern, unspecified headache type   6. Encounter for general adult medical examination with abnormal findings      Plan: 1.  For now we will treat his symptoms I recommended he get Allegra over-the-counter and take 1 tablet by mouth daily as well as Flonase nasal spray and I recommend he administer 2 sprays in each nare daily.  He was  told that if his symptoms persist into next week that he should notify us and we may consider antibiotic therapy if needed. 2.-6.  We will collect blood work for further evaluation.  If any anemia is noted will recommend referral to GI to rule out gastrointestinal bleeding as a source for anemia.  He was also told if you notices any obvious bright red blood or black tarry stool that he should notify us for further evaluation.   Tests ordered Orders Placed This Encounter  Procedures  . CBC with Differential/Platelets  . CMP with eGFR([facility])  . Lipid Panel  . Hemoglobin A1c  . TSH  . Vitamin D, 25-hydroxy      Meds ordered this encounter  Medications  . fluticasone (FLONASE) 50 MCG/ACT nasal spray    Sig: Place 2 sprays into both nostrils daily.    Dispense:  16 g    Refill:  6    Order Specific Question:   Supervising Provider    Answer:   Doree Albee [[id number]]    Patient to follow-up in 1 month for annual physical or sooner as needed  Ailene Ards, NP

## 2020-10-15 ENCOUNTER — Encounter (INDEPENDENT_AMBULATORY_CARE_PROVIDER_SITE_OTHER): Payer: Self-pay | Admitting: Nurse Practitioner

## 2020-10-15 DIAGNOSIS — E559 Vitamin D deficiency, unspecified: Secondary | ICD-10-CM | POA: Insufficient documentation

## 2020-10-15 LAB — TSH: TSH: 0.76 mIU/L (ref 0.40–4.50)

## 2020-10-15 LAB — COMPLETE METABOLIC PANEL WITH GFR
AG Ratio: 1.4 (calc) (ref 1.0–2.5)
ALT: 35 U/L (ref 9–46)
AST: 43 U/L — ABNORMAL HIGH (ref 10–40)
Albumin: 4.4 g/dL (ref 3.6–5.1)
Alkaline phosphatase (APISO): 83 U/L (ref 36–130)
BUN: 7 mg/dL (ref 7–25)
CO2: 26 mmol/L (ref 20–32)
Calcium: 9.6 mg/dL (ref 8.6–10.3)
Chloride: 103 mmol/L (ref 98–110)
Creat: 1.21 mg/dL (ref 0.60–1.35)
GFR, Est African American: 94 mL/min/{1.73_m2} (ref >=60)
GFR, Est Non African American: 81 mL/min/{1.73_m2} (ref >=60)
Globulin: 3.1 g/dL (calc) (ref 1.9–3.7)
Glucose, Bld: 82 mg/dL (ref 65–139)
Potassium: 4.1 mmol/L (ref 3.5–5.3)
Sodium: 139 mmol/L (ref 135–146)
Total Bilirubin: 0.9 mg/dL (ref 0.2–1.2)
Total Protein: 7.5 g/dL (ref 6.1–8.1)

## 2020-10-15 LAB — LIPID PANEL
Cholesterol: 243 mg/dL — ABNORMAL HIGH (ref <200)
HDL: 95 mg/dL (ref >=40)
LDL Cholesterol (Calc): 123 mg/dL (calc) — ABNORMAL HIGH
Non-HDL Cholesterol (Calc): 148 mg/dL (calc) — ABNORMAL HIGH (ref <130)
Total CHOL/HDL Ratio: 2.6 (calc) (ref <5.0)
Triglycerides: 132 mg/dL (ref <150)

## 2020-10-15 LAB — CBC WITH DIFFERENTIAL/PLATELET
Absolute Monocytes: 956 cells/uL — ABNORMAL HIGH (ref 200–950)
Basophils Absolute: 61 cells/uL (ref 0–200)
Basophils Relative: 0.5 %
Eosinophils Absolute: 169 cells/uL (ref 15–500)
Eosinophils Relative: 1.4 %
HCT: 46.9 % (ref 38.5–50.0)
Hemoglobin: 15.4 g/dL (ref 13.2–17.1)
Lymphs Abs: 1839 cells/uL (ref 850–3900)
MCH: 29.2 pg (ref 27.0–33.0)
MCHC: 32.8 g/dL (ref 32.0–36.0)
MCV: 89 fL (ref 80.0–100.0)
MPV: 10.9 fL (ref 7.5–12.5)
Monocytes Relative: 7.9 %
Neutro Abs: 9075 cells/uL — ABNORMAL HIGH (ref 1500–7800)
Neutrophils Relative %: 75 %
Platelets: 270 10*3/uL (ref 140–400)
RBC: 5.27 10*6/uL (ref 4.20–5.80)
RDW: 12.9 % (ref 11.0–15.0)
Total Lymphocyte: 15.2 %
WBC: 12.1 10*3/uL — ABNORMAL HIGH (ref 3.8–10.8)

## 2020-10-15 LAB — HEMOGLOBIN A1C
Hgb A1c MFr Bld: 5.4 % of total Hgb (ref <5.7)
Mean Plasma Glucose: 108 mg/dL
eAG (mmol/L): 6 mmol/L

## 2020-10-15 LAB — VITAMIN D 25 HYDROXY (VIT D DEFICIENCY, FRACTURES): Vit D, 25-Hydroxy: 7 ng/mL — ABNORMAL LOW (ref 30–100)

## 2020-11-01 ENCOUNTER — Encounter (INDEPENDENT_AMBULATORY_CARE_PROVIDER_SITE_OTHER): Payer: Self-pay | Admitting: Nurse Practitioner

## 2020-11-01 DIAGNOSIS — E785 Hyperlipidemia, unspecified: Secondary | ICD-10-CM | POA: Insufficient documentation

## 2020-11-10 ENCOUNTER — Encounter (INDEPENDENT_AMBULATORY_CARE_PROVIDER_SITE_OTHER): Payer: Self-pay | Admitting: Nurse Practitioner

## 2020-11-10 ENCOUNTER — Ambulatory Visit (INDEPENDENT_AMBULATORY_CARE_PROVIDER_SITE_OTHER): Payer: BC Managed Care – PPO | Admitting: Nurse Practitioner

## 2020-11-10 ENCOUNTER — Other Ambulatory Visit: Payer: Self-pay

## 2020-11-10 VITALS — BP 102/80 | HR 66 | Temp 96.0°F | Ht 70.0 in | Wt 181.8 lb

## 2020-11-10 DIAGNOSIS — E559 Vitamin D deficiency, unspecified: Secondary | ICD-10-CM

## 2020-11-10 DIAGNOSIS — E785 Hyperlipidemia, unspecified: Secondary | ICD-10-CM | POA: Diagnosis not present

## 2020-11-10 DIAGNOSIS — R002 Palpitations: Secondary | ICD-10-CM

## 2020-11-10 NOTE — Patient Instructions (Signed)
[facility] Dietary Recommendations for Weight Loss What to Avoid . Avoid added sugars o Often added sugar can be found in processed foods such as many condiments, dry cereals, cakes, cookies, chips, crisps, crackers, candies, sweetened drinks, etc.  o Read labels and AVOID/DECREASE use of foods with the following in their ingredient list: Sugar, fructose, high fructose corn syrup, sucrose, glucose, maltose, dextrose, molasses, cane sugar, brown sugar, any type of syrup, agave nectar, etc.   . Avoid snacking in between meals . Avoid foods made with flour o If you are going to eat food made with flour, choose those made with whole-grains; and, minimize your consumption as much as is tolerable . Avoid processed foods o These foods are generally stocked in the middle of the grocery store. Focus on shopping on the perimeter of the grocery.  . Avoid Meat  o We recommend following a plant-based diet at [facility]. Thus, we recommend avoiding meat as a general rule. Consider eating beans, legumes, eggs, and/or dairy products for regular protein sources o If you plan on eating meat limit to 4 ounces of meat at a time and choose lean options such as Fish, chicken, turkey. Avoid red meat intake such as pork and/or steak What to Include . Vegetables o GREEN LEAFY VEGETABLES: Kale, spinach, mustard greens, collard greens, cabbage, broccoli, etc. o OTHER: Asparagus, cauliflower, eggplant, carrots, peas, Brussel sprouts, tomatoes, bell peppers, zucchini, beets, cucumbers, etc. . Grains, seeds, and legumes o Beans: kidney beans, black eyed peas, garbanzo beans, black beans, pinto beans, etc. o Whole, unrefined grains: brown rice, barley, bulgur, oatmeal, etc. . Healthy fats  o Avoid highly processed fats such as vegetable oil o Examples of healthy fats: avocado, olives, virgin olive oil, dark chocolate (?72% Cocoa), nuts (peanuts, almonds, walnuts, cashews, pecans, etc.) . None to Low  Intake of Animal Sources of Protein o Meat sources: chicken, turkey, salmon, tuna. Limit to 4 ounces of meat at one time. o Consider limiting dairy sources, but when choosing dairy focus on: PLAIN Greek yogurt, cottage cheese, high-protein milk . Fruit o Choose berries  When to Eat . Intermittent Fasting: o Choosing not to eat for a specific time period, but DO FOCUS ON HYDRATION when fasting o Multiple Techniques: - Time Restricted Eating: eat 3 meals in a day, each meal lasting no more than 60 minutes, no snacks between meals - 16-18 hour fast: fast for 16 to 18 hours up to 7 days a week. Often suggested to start with 2-3 nonconsecutive days per week.  . Remember the time you sleep is counted as fasting.  . Examples of eating schedule: Fast from 7:00pm-11:00am. Eat between 11:00am-7:00pm.  - 24-hour fast: fast for 24 hours up to every other day. Often suggested to start with 1 day per week . Remember the time you sleep is counted as fasting . Examples of eating schedule:  o Eating day: eat 2-3 meals on your eating day. If doing 2 meals, each meal should last no more than 90 minutes. If doing 3 meals, each meal should last no more than 60 minutes. Finish last meal by 7:00pm. o Fasting day: Fast until 7:00pm.  o IF YOU FEEL UNWELL FOR ANY REASON/IN ANY WAY WHEN FASTING, STOP FASTING BY EATING A NUTRITIOUS SNACK OR LIGHT MEAL o ALWAYS FOCUS ON HYDRATION DURING FASTS - Acceptable Hydration sources: water, broths, tea/coffee (black tea/coffee is best but using a small amount of whole-fat dairy products in coffee/tea is acceptable).  -   Poor Hydration Sources: anything with sugar or artificial sweeteners added to it  These recommendations have been developed for patients that are actively receiving medical care from either Dr. [facility] or Palmira Stickle, DNP, NP-C at [facility]. These recommendations are developed for patients with specific medical conditions and are not meant to be  distributed or used by others that are not actively receiving care from either provider listed above at [facility]. It is not appropriate to participate in the above eating plans without proper medical supervision.   Reference: Fung, J. The obesity code. [place]: [place]; 2016.   

## 2020-11-10 NOTE — Progress Notes (Signed)
Subjective:  Patient ID: Ivan Macias, male    DOB: 05-May-1991  Age: 30 y.o. MRN: [id number]  CC:  Chief Complaint  Patient presents with  . Follow-up    Doing okay, no concerns  . Other    Vitamin D Deficiency, cardiac palpitations  . Hyperlipidemia      HPI  This patient arrives today for the above.   Vitamin D deficiency: At last office visit we checked blood work again showed vitamin D level of 7.  He is not currently on any supplement at this time.  Cardiac palpitations: He tells me he will intermittently have these episodes where his chest feels "funny".  He does not experience pain but he feels that on a weekly basis he is experiencing what sounds like cardiac palpitations.  They last about 10 minutes and then will go away on his own.  He denies any shortness of breath during the palpitations.  He does report smoking cigarettes and drinking quite a bit of caffeine during the day.  He does not currently have an episode today.  Hyperlipidemia: Last office visit blood work was collected and did show that he has elevated total cholesterol and LDL.  HDL is great at 95.  History reviewed. No pertinent past medical history.    History reviewed. No pertinent family history.  Social History   Social History Narrative  . Not on file   Social History   Tobacco Use  . Smoking status: Current Every Day Smoker    Packs/day: 0.50    Years: 10.00    Pack years: 5.00    Types: Cigarettes  . Smokeless tobacco: Never Used  Substance Use Topics  . Alcohol use: Yes    Alcohol/week: 2.0 - 3.0 standard drinks    Types: 2 - 3 Cans of beer per week    Comment: daily     Current Meds  Medication Sig  . Cholecalciferol (VITAMIN D-3) 125 MCG (5000 UT) TABS Take 1 tablet by mouth daily.    ROS:  Review of Systems  Constitutional: Negative for fever, malaise/fatigue and weight loss.  Respiratory: Negative for shortness of breath.   Cardiovascular: Negative for chest  pain.  Gastrointestinal: Positive for nausea (when skipping breakfast).  Neurological: Negative for dizziness and headaches.     Objective:   Today's Vitals: BP 102/80   Pulse 66   Temp (!) 96 F (35.6 C) (Temporal)   Ht 5\' 10"  (1.778 m)   Wt 181 lb 12.8 oz (82.5 kg)   SpO2 97%   BMI 26.09 kg/m  Vitals with BMI 11/10/2020 10/14/2020 05/18/2020  Height 5\' 10"  6\' 2"  6\' 0"   Weight 181 lbs 13 oz 177 lbs 6 oz 175 lbs  BMI 26.09 22.77 23.73  Systolic 102 128 05/20/2020  Diastolic 80 66 90  Pulse 66 86 82     Physical Exam Vitals reviewed.  Constitutional:      Appearance: Normal appearance.  HENT:     Head: Normocephalic and atraumatic.  Cardiovascular:     Rate and Rhythm: Normal rate and regular rhythm.  Pulmonary:     Effort: Pulmonary effort is normal.     Breath sounds: Normal breath sounds.  Musculoskeletal:     Cervical back: Neck supple.  Skin:    General: Skin is warm and dry.  Neurological:     Mental Status: He is alert and oriented to person, place, and time.  Psychiatric:  Mood and Affect: Mood normal.        Behavior: Behavior normal.        Thought Content: Thought content normal.        Judgment: Judgment normal.      EKG: NSR with RSR    Assessment and Plan   1. Palpitations   2. Vitamin D deficiency   3. Hyperlipidemia, unspecified hyperlipidemia type      Plan: 1.  For now I have recommended that he really cut back on his smoking and caffeine intake.  He will follow-up in 3 months and if he is still experiencing these palpitations we will refer him to cardiology.  I told him if in between now and his next appointment if the palpitations seem to become more frequent or worsen in any way to let me know at which point we will refer to cardiology sooner. 2. He was told to start a vitamin D3 supplement. I have recommended he start taking 5,000IUs by mouth daily. 3. We discussed his cholesterol I recommend he focus just on lifestyle changes to  start.  I encouraged him to focus on working on a regular basis which he already does.  We also discussed trying to eat healthy meals I have given him a handout with recommendations regarding this.   Tests ordered Orders Placed This Encounter  Procedures  . EKG 12-Lead      No orders of the defined types were placed in this encounter.   Patient to follow-up in 2-3 months for annual physical exam or sooner as needed.  Elenore Paddy, NP

## 2021-02-16 ENCOUNTER — Encounter (INDEPENDENT_AMBULATORY_CARE_PROVIDER_SITE_OTHER): Payer: BC Managed Care – PPO | Admitting: Nurse Practitioner

## 2021-08-08 ENCOUNTER — Emergency Department (HOSPITAL_COMMUNITY)
Admission: EM | Admit: 2021-08-08 | Discharge: 2021-08-08 | Disposition: A | Discharge status: Home / Self Care | Payer: Managed Care, Other (non HMO) | Attending: Emergency Medicine | Admitting: Emergency Medicine

## 2021-08-08 ENCOUNTER — Emergency Department (HOSPITAL_COMMUNITY): Payer: Managed Care, Other (non HMO)

## 2021-08-08 ENCOUNTER — Other Ambulatory Visit: Payer: Self-pay

## 2021-08-08 ENCOUNTER — Encounter (HOSPITAL_COMMUNITY): Payer: Self-pay | Admitting: *Deleted

## 2021-08-08 DIAGNOSIS — R0789 Other chest pain: Secondary | ICD-10-CM | POA: Insufficient documentation

## 2021-08-08 DIAGNOSIS — R1013 Epigastric pain: Secondary | ICD-10-CM | POA: Diagnosis not present

## 2021-08-08 DIAGNOSIS — R079 Chest pain, unspecified: Secondary | ICD-10-CM | POA: Diagnosis present

## 2021-08-08 LAB — CBC
HCT: 46.9 % (ref 39.0–52.0)
Hemoglobin: 15.5 g/dL (ref 13.0–17.0)
MCH: 30.2 pg (ref 26.0–34.0)
MCHC: 33 g/dL (ref 30.0–36.0)
MCV: 91.4 fL (ref 80.0–100.0)
Platelets: 248 10*3/uL (ref 150–400)
RBC: 5.13 MIL/uL (ref 4.22–5.81)
RDW: 13.7 % (ref 11.5–15.5)
WBC: 8.4 10*3/uL (ref 4.0–10.5)
nRBC: 0 % (ref 0.0–0.2)

## 2021-08-08 LAB — TROPONIN I (HIGH SENSITIVITY)
Troponin I (High Sensitivity): 4 ng/L (ref <18)
Troponin I (High Sensitivity): 3 ng/L (ref <18)

## 2021-08-08 LAB — BASIC METABOLIC PANEL
Anion gap: 8 (ref 5–15)
BUN: 10 mg/dL (ref 6–20)
CO2: 26 mmol/L (ref 22–32)
Calcium: 9.2 mg/dL (ref 8.9–10.3)
Chloride: 102 mmol/L (ref 98–111)
Creatinine, Ser: 1.22 mg/dL (ref 0.61–1.24)
GFR, Estimated: >60 mL/min (ref >60)
Glucose, Bld: 96 mg/dL (ref 70–99)
Potassium: 3.8 mmol/L (ref 3.5–5.1)
Sodium: 136 mmol/L (ref 135–145)

## 2021-08-08 LAB — HEPATIC FUNCTION PANEL
ALT: 35 U/L (ref 0–44)
AST: 38 U/L (ref 15–41)
Albumin: 4.2 g/dL (ref 3.5–5.0)
Alkaline Phosphatase: 74 U/L (ref 38–126)
Bilirubin, Direct: 0.2 mg/dL (ref 0.0–0.2)
Indirect Bilirubin: 1.4 mg/dL — ABNORMAL HIGH (ref 0.3–0.9)
Total Bilirubin: 1.6 mg/dL — ABNORMAL HIGH (ref 0.3–1.2)
Total Protein: 7.2 g/dL (ref 6.5–8.1)

## 2021-08-08 LAB — LIPASE, BLOOD: Lipase: 28 U/L (ref 11–51)

## 2021-08-08 MED ORDER — ALUM & MAG HYDROXIDE-SIMETH 200-200-20 MG/5ML PO SUSP
30.0000 mL | Freq: Once | ORAL | Status: AC
  Administered 2021-08-08: 30 mL via ORAL
  Filled 2021-08-08: qty 30

## 2021-08-08 MED ORDER — LIDOCAINE VISCOUS HCL 2 % MT SOLN
15.0000 mL | Freq: Once | OROMUCOSAL | Status: AC
  Administered 2021-08-08: 15 mL via ORAL
  Filled 2021-08-08: qty 15

## 2021-08-08 NOTE — ED Provider Notes (Signed)
[facility] EMERGENCY DEPARTMENT Provider Note   CSN: [id number] Arrival date & time: 08/08/21  0100     History  Chief Complaint  Patient presents with   Chest Pain    Ivan Macias is a 31 y.o. male.  Presents to the emergency department with chief complaint of chest pain.  Chest pain has been ongoing for about 1 week.  It has been intermittent.  He initially noticed it after he was smoking marijuana, however he has not used since then he is continue to have symptoms.  He states his pain is in the left side of his chest.  Unable to describe it.  He says sometimes he feels the pain in his epigastric stomach region and going up into his esophagus.  He does not know if it occurs with rest versus exertion.  He denies any shortness of breath, nausea, vomiting, numbness, or weakness.   Chest Pain Associated symptoms: no nausea, no numbness, no shortness of breath, no vomiting and no weakness       Home Medications Prior to Admission medications   Medication Sig Start Date End Date Taking? Authorizing Provider  Cholecalciferol (VITAMIN D-3) 125 MCG (5000 UT) TABS Take 1 tablet by mouth daily.    [provider]  dicyclomine (BENTYL) 20 MG tablet Take 1 tablet (20 mg total) by mouth 2 (two) times daily. 12/26/19 05/14/20  Gailen Shelter, PA  omeprazole (PRILOSEC) 20 MG capsule Take 1 capsule (20 mg total) by mouth daily for 14 days. 12/26/19 05/14/20  Gailen Shelter, PA      Allergies    Meloxicam    Review of Systems   Review of Systems  Respiratory:  Negative for shortness of breath.   Cardiovascular:  Positive for chest pain. Negative for leg swelling.  Gastrointestinal:  Negative for nausea and vomiting.  Neurological:  Negative for weakness and numbness.  All other systems reviewed and are negative.  Physical Exam Updated Vital Signs BP 125/88    Pulse (!) 58    Temp 98.2 F (36.8 C) (Oral)    Resp 18    Ht 6' (1.829 m)    Wt 86.2 kg    SpO2 97%    BMI 25.77  kg/m  Physical Exam Vitals and nursing note reviewed.  Constitutional:      General: He is not in acute distress.    Appearance: Normal appearance. He is not ill-appearing, toxic-appearing or diaphoretic.  HENT:     Head: Normocephalic and atraumatic.     Nose: No nasal deformity.     Mouth/Throat:     Lips: Pink. No lesions.     Mouth: No injury, lacerations, oral lesions or angioedema.     Pharynx: Uvula midline. No pharyngeal swelling or uvula swelling.  Eyes:     General: Gaze aligned appropriately. No scleral icterus.       Right eye: No discharge.        Left eye: No discharge.     Conjunctiva/sclera: Conjunctivae normal.     Right eye: Right conjunctiva is not injected. No exudate or hemorrhage.    Left eye: Left conjunctiva is not injected. No exudate or hemorrhage. Cardiovascular:     Rate and Rhythm: Normal rate and regular rhythm.     Pulses: Normal pulses.          Radial pulses are 2+ on the right side and 2+ on the left side.       Dorsalis pedis pulses are  2+ on the right side and 2+ on the left side.     Heart sounds: Normal heart sounds, S1 normal and S2 normal. Heart sounds not distant. No murmur heard.   No friction rub. No gallop. No S3 or S4 sounds.  Pulmonary:     Effort: Pulmonary effort is normal. No accessory muscle usage or respiratory distress.     Breath sounds: Normal breath sounds. No stridor. No wheezing, rhonchi or rales.  Chest:     Chest wall: No tenderness.  Abdominal:     General: Abdomen is flat. Bowel sounds are normal. There is no distension.     Palpations: Abdomen is soft. There is no mass or pulsatile mass.     Tenderness: There is no abdominal tenderness. There is no guarding or rebound.  Musculoskeletal:     Right lower leg: No edema.     Left lower leg: No edema.  Skin:    General: Skin is warm and dry.     Coloration: Skin is not jaundiced or pale.     Findings: No bruising, erythema, lesion or rash.  Neurological:      General: No focal deficit present.     Mental Status: He is alert and oriented to person, place, and time.     GCS: GCS eye subscore is 4. GCS verbal subscore is 5. GCS motor subscore is 6.  Psychiatric:        Mood and Affect: Mood normal.        Behavior: Behavior normal. Behavior is cooperative.    ED Results / Procedures / Treatments   Labs (all labs ordered are listed, but only abnormal results are displayed) Labs Reviewed  HEPATIC FUNCTION PANEL - Abnormal; Notable for the following components:      Result Value   Total Bilirubin 1.6 (*)    Indirect Bilirubin 1.4 (*)    All other components within normal limits  BASIC METABOLIC PANEL  CBC  LIPASE, BLOOD  TROPONIN I (HIGH SENSITIVITY)  TROPONIN I (HIGH SENSITIVITY)    EKG None  Radiology DG Chest Port 1 View  Result Date: 08/08/2021 CLINICAL DATA:  Intermittent chest pain and near syncopal episode. EXAM: PORTABLE CHEST 1 VIEW COMPARISON:  05/13/2020. FINDINGS: Trachea is midline. Heart size normal. Lungs are clear. No pleural fluid. IMPRESSION: No acute findings. Electronically Signed   By: Leanna Battles M.D.   On: 08/08/2021 11:34    Procedures Procedures  On cardiac monitoring  Medications Ordered in ED Medications  alum & mag hydroxide-simeth (MAALOX/MYLANTA) 200-200-20 MG/5ML suspension 30 mL (30 mLs Oral Given 08/08/21 1143)    And  lidocaine (XYLOCAINE) 2 % viscous mouth solution 15 mL (15 mLs Oral Given 08/08/21 1143)    ED Course/ Medical Decision Making/ A&P                           Medical Decision Making Amount and/or Complexity of Data Reviewed Labs: ordered. Radiology: ordered.  Risk OTC drugs. Prescription drug management.   This is a 31 y.o. male who presents to the ED with intermittent chest pain over the past week.  Vitals are reassuring. Exam reassuring. Chest pain described to be atypical. Reflux symptoms lead me to believe that this could be gastrointestinal related. Ddx includes ACS,  spontaneous ptx, msk cause, or gi etiology. PERC negative for PE. History not consistent with dissection or esophageal rupture.   I personally reviewed all laboratory work and imaging. Abnormal  results outlined below.  CBC, BMP, hepatic function panel, Lipase, troponin x2 are all unremarkable. CXR normal. EKG sinus arrhythmia.   Impression: Unremarkable workup. Patient's symptoms are gone after GI cocktail. Leads me to believe that this is GI related. I discussed this with the patient. He should trial tums or pepcid if experiencing these symptoms at home to see if they resolve. Return precautions are provided.   Portions of this note were generated with Scientist, clinical (histocompatibility and immunogenetics). Dictation errors may occur despite best attempts at proofreading.   Final Clinical Impression(s) / ED Diagnoses Final diagnoses:  Nonspecific chest pain    Rx / DC Orders ED Discharge Orders     None         Claudie Leach, PA-C 08/08/21 2327    Bethann Berkshire, MD 08/09/21 [phone]

## 2021-08-08 NOTE — ED Triage Notes (Signed)
Pt c/o intermittent chest pain x 1 week. Pt reports he had near syncopal episode on Saturday. Denies SOB, nausea.

## 2021-08-08 NOTE — Discharge Instructions (Signed)
I think that your symptoms are likely related to acid build up in your stomach. If you start feeling these sensations, you can try to take tums or pepsid. You can buy one of these medications over the counter. If you develop worsening or concerning symptoms, please return to the ED.

## 2021-11-07 IMAGING — DX DG CHEST 2V
2 series · 2 of 2 positions shown · non-contrast
Comparison: 02/17/2010

CLINICAL DATA: Chest pain

EXAM:
CHEST - 2 VIEW

[chest lat]
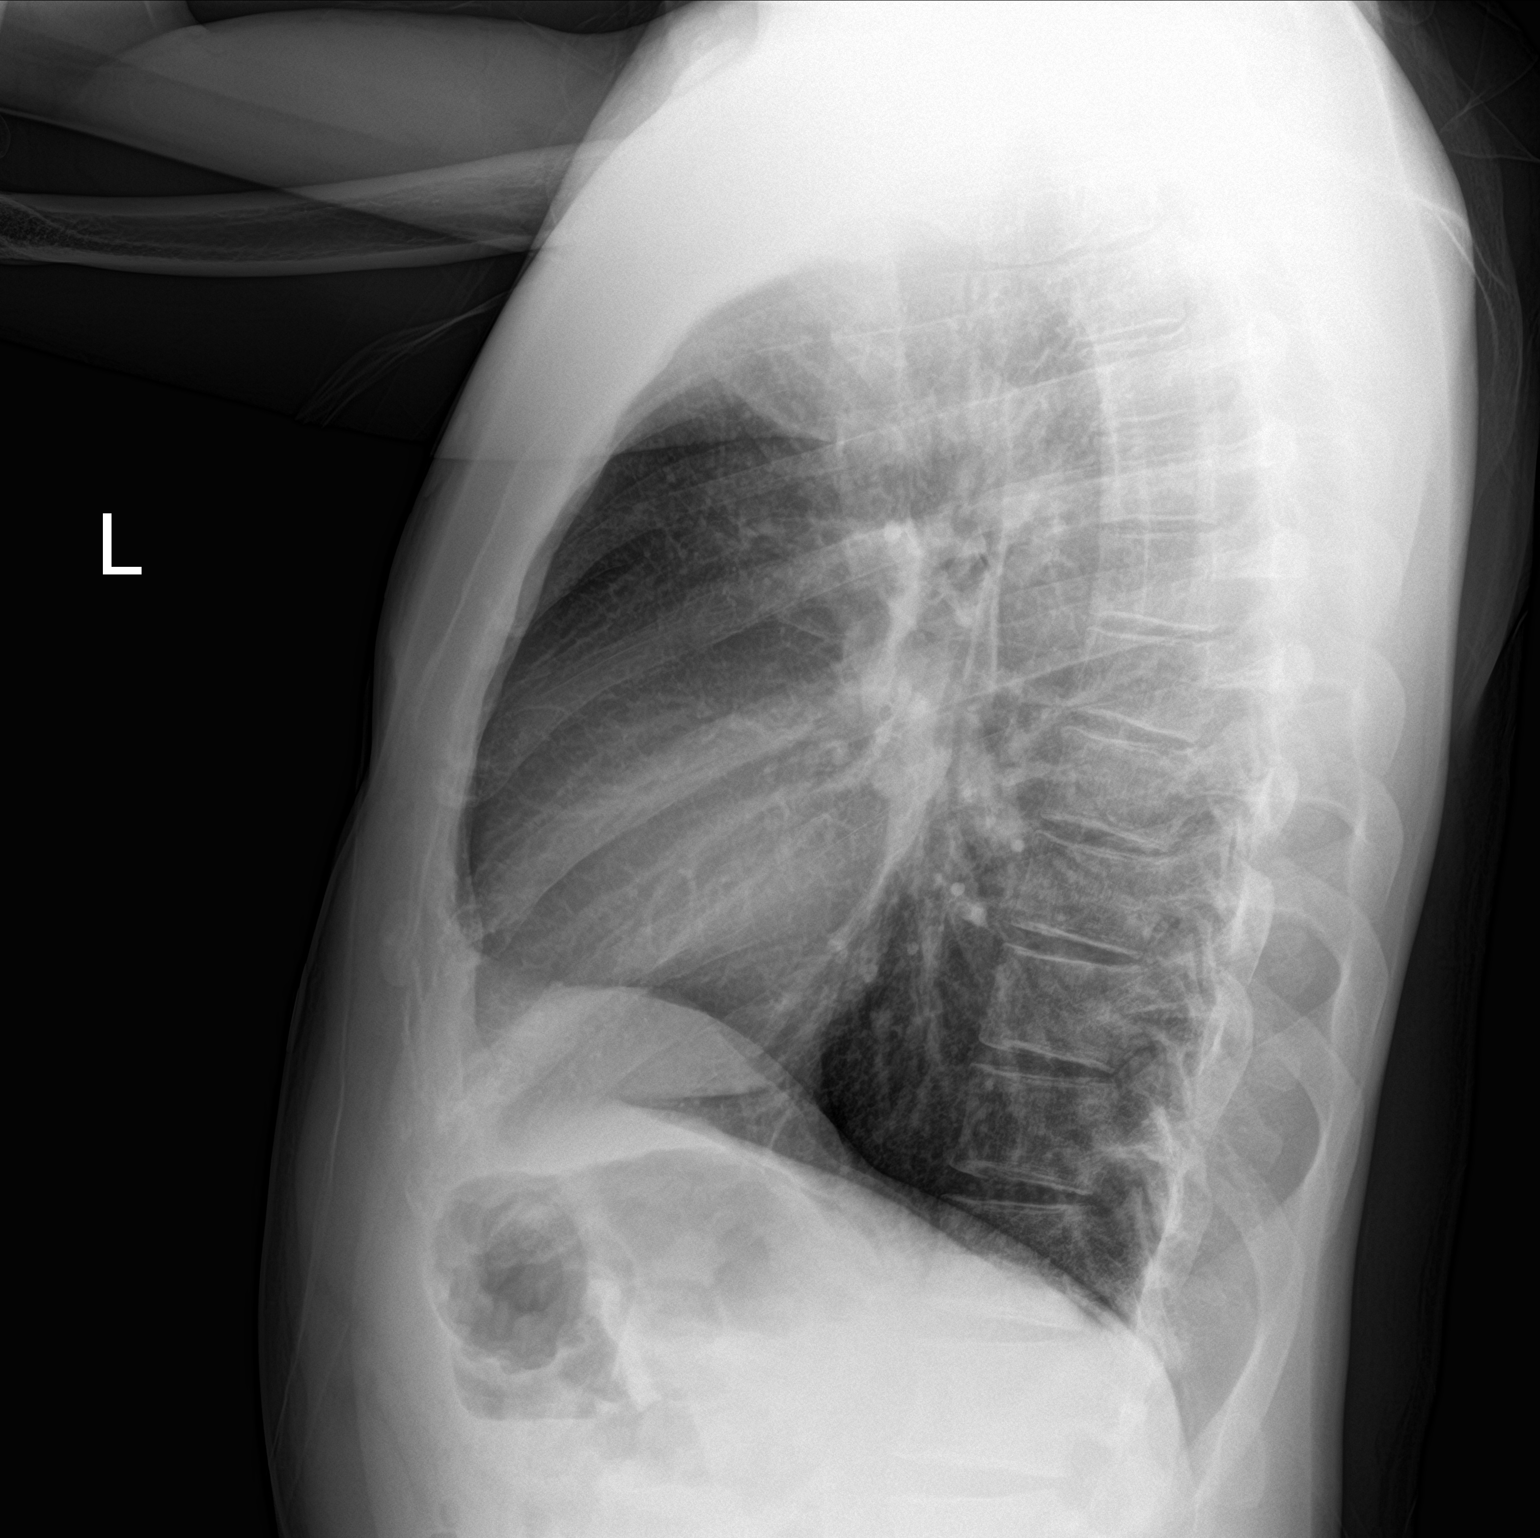

[chest ap]
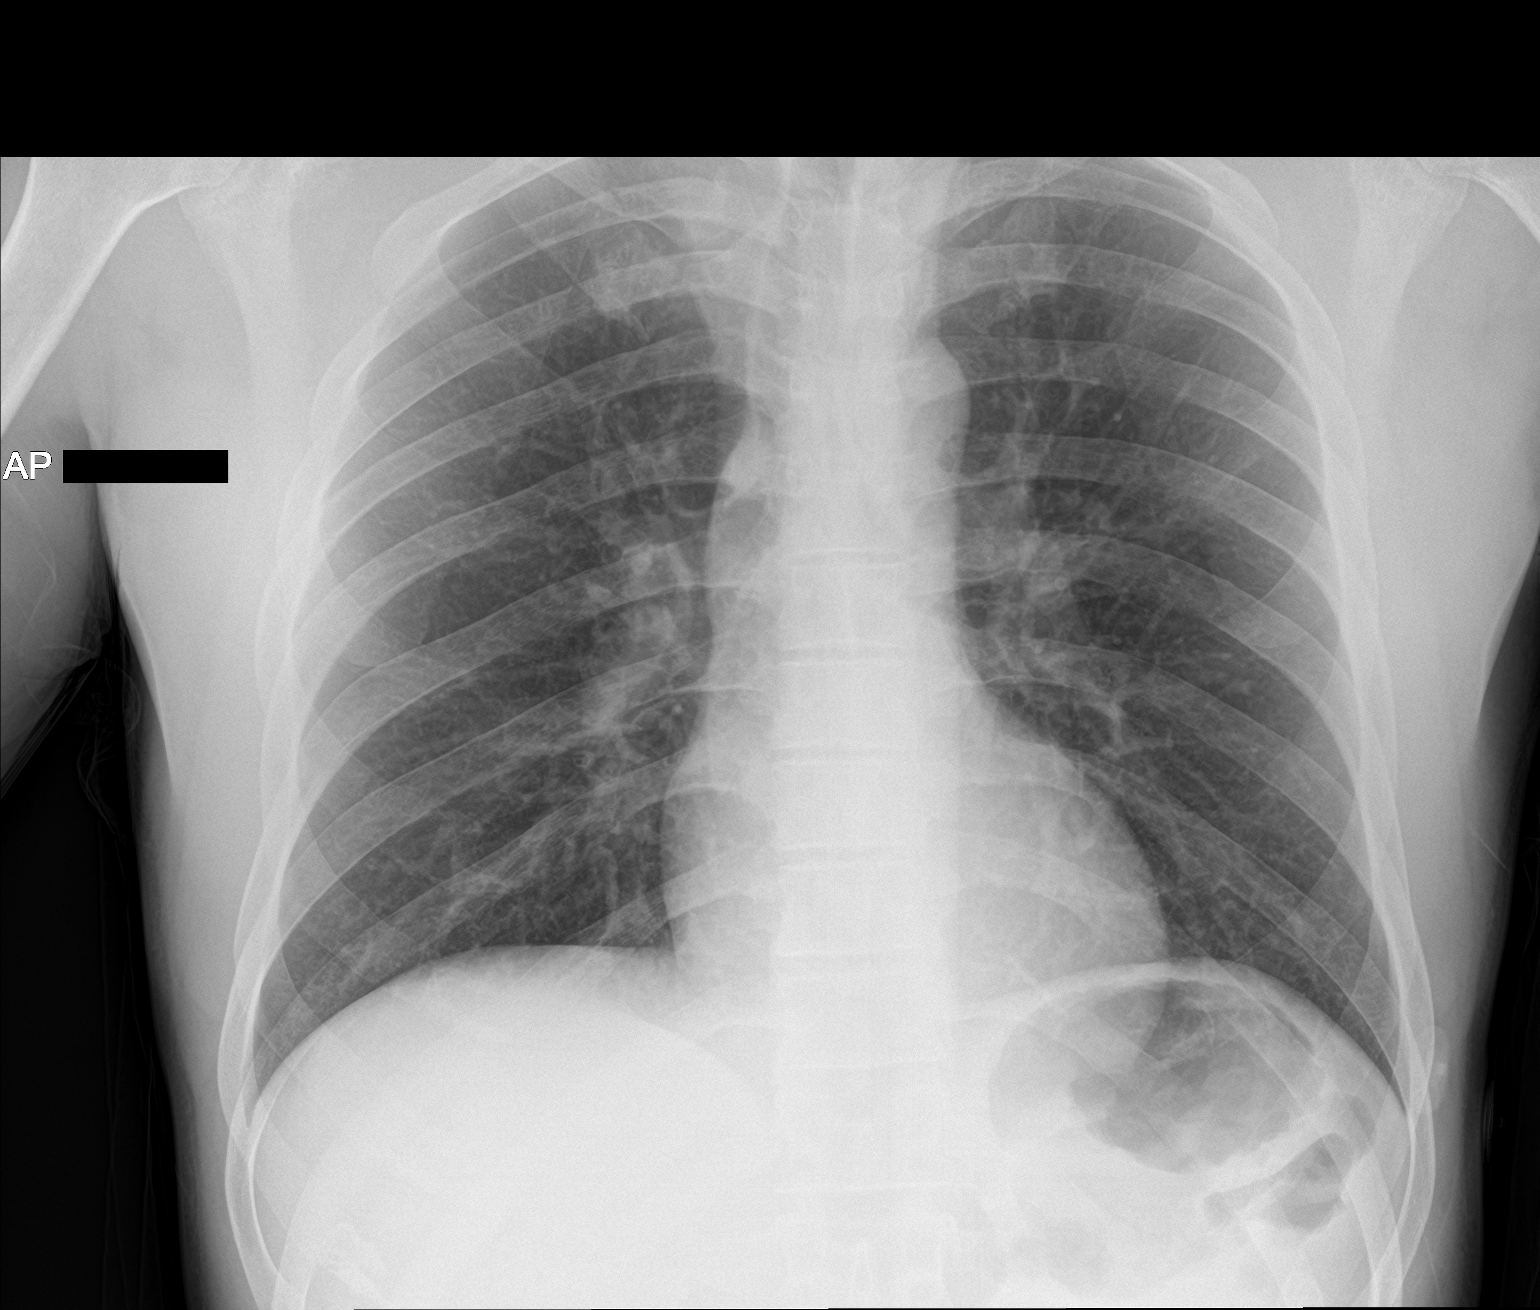

[2 of 2 positions shown; findings below may reference images not displayed]

FINDINGS: The heart size and mediastinal contours are within normal limits.
Both lungs are clear. The visualized skeletal structures are
unremarkable.
IMPRESSION: No active cardiopulmonary disease.

## 2021-12-02 ENCOUNTER — Ambulatory Visit
Admission: EM | Admit: 2021-12-02 | Discharge: 2021-12-02 | Disposition: A | Discharge status: Home / Self Care | Payer: Managed Care, Other (non HMO) | Attending: Family Medicine | Admitting: Family Medicine

## 2021-12-02 ENCOUNTER — Encounter: Payer: Self-pay | Admitting: Emergency Medicine

## 2021-12-02 DIAGNOSIS — R000 Tachycardia, unspecified: Secondary | ICD-10-CM | POA: Diagnosis not present

## 2021-12-02 DIAGNOSIS — R109 Unspecified abdominal pain: Secondary | ICD-10-CM | POA: Diagnosis not present

## 2021-12-02 DIAGNOSIS — R509 Fever, unspecified: Secondary | ICD-10-CM

## 2021-12-02 LAB — POCT URINALYSIS DIP (MANUAL ENTRY)
Bilirubin, UA: NEGATIVE
Glucose, UA: NEGATIVE mg/dL
Ketones, POC UA: NEGATIVE mg/dL
Leukocytes, UA: NEGATIVE
Nitrite, UA: NEGATIVE
Protein Ur, POC: NEGATIVE mg/dL
Spec Grav, UA: 1.02 (ref 1.010–1.025)
Urobilinogen, UA: 0.2 E.U./dL
pH, UA: 7 (ref 5.0–8.0)

## 2021-12-02 MED ORDER — TAMSULOSIN HCL 0.4 MG PO CAPS: 0.4000 mg | ORAL_CAPSULE | Freq: Every day | ORAL | 0 refills | Status: DC

## 2021-12-02 MED ORDER — ACETAMINOPHEN 325 MG PO TABS
975.0000 mg | ORAL_TABLET | Freq: Once | ORAL | Status: AC
  Administered 2021-12-02: 975 mg via ORAL

## 2021-12-02 NOTE — ED Provider Notes (Signed)
[facility]    CSN: [id number] Arrival date & time: 12/02/21  1810      History   Chief Complaint No chief complaint on file.   HPI Ivan Macias is a 31 y.o. male.   Patient presenting today with sudden onset left flank pain radiating around to left lower abdomen that started while he was at work this afternoon.  States he feels weak overall, achy and has had sweats the past hour or so.  Pain seems worse with taking a deep breath.  He denies nausea, vomiting, diarrhea, constipation, sore throat, congestion, cough.  So far not trying anything over-the-counter for symptoms.  No known sick contacts recently.  No past history of similar issues.   History reviewed. No pertinent past medical history.  Patient Active Problem List   Diagnosis Date Noted   Hyperlipidemia 11/01/2020   Vitamin D deficiency 10/15/2020   UNSPECIFIED SITE OF ANKLE SPRAIN AND STRAIN 05/16/2010   HALLUX VALGUS 11/11/2007    History reviewed. No pertinent surgical history.     Home Medications    Prior to Admission medications   Medication Sig Start Date End Date Taking? Authorizing Provider  tamsulosin (FLOMAX) 0.4 MG CAPS capsule Take 1 capsule (0.4 mg total) by mouth daily. 12/02/21  Yes Volney American, PA-C  Cholecalciferol (VITAMIN D-3) 125 MCG (5000 UT) TABS Take 1 tablet by mouth daily.    [provider]  dicyclomine (BENTYL) 20 MG tablet Take 1 tablet (20 mg total) by mouth 2 (two) times daily. 12/26/19 05/14/20  Tedd Sias, PA  omeprazole (PRILOSEC) 20 MG capsule Take 1 capsule (20 mg total) by mouth daily for 14 days. 12/26/19 05/14/20  Tedd Sias, PA    Family History History reviewed. No pertinent family history.  Social History Social History   Tobacco Use   Smoking status: Every Day    Packs/day: 0.50    Years: 10.00    Pack years: 5.00    Types: Cigarettes   Smokeless tobacco: Never  Vaping Use   Vaping Use: Former  Substance Use  Topics   Alcohol use: Yes    Alcohol/week: 2.0 - 3.0 standard drinks    Types: 2 - 3 Cans of beer per week    Comment: daily   Drug use: Yes    Frequency: 1.0 times per week    Types: Marijuana     Allergies   Meloxicam   Review of Systems Review of Systems Per HPI  Physical Exam Triage Vital Signs ED Triage Vitals  Enc Vitals Group     BP 12/02/21 1837 133/81     Pulse Rate 12/02/21 1837 (!) 113     Resp 12/02/21 1837 18     Temp 12/02/21 1837 (!) 101.9 F (38.8 C)     Temp Source 12/02/21 1837 Oral     SpO2 12/02/21 1837 93 %     Weight --      Height --      Head Circumference --      Peak Flow --      Pain Score 12/02/21 1838 8     Pain Loc --      Pain Edu? --      Excl. in [place]? --    No data found.  Updated Vital Signs BP 133/81 (BP Location: Right Arm)   Pulse (!) 113   Temp (!) 101.9 F (38.8 C) (Oral)   Resp 18   SpO2 93%  Visual Acuity Right Eye Distance:   Left Eye Distance:   Bilateral Distance:    Right Eye Near:   Left Eye Near:    Bilateral Near:     Physical Exam Vitals and nursing note reviewed.  Constitutional:      Appearance: Normal appearance. He is diaphoretic.     Comments: Appears in no acute distress, sitting comfortably on exam table  HENT:     Head: Atraumatic.     Mouth/Throat:     Mouth: Mucous membranes are moist.  Eyes:     Extraocular Movements: Extraocular movements intact.     Conjunctiva/sclera: Conjunctivae normal.  Cardiovascular:     Rate and Rhythm: Regular rhythm. Tachycardia present.     Heart sounds: Normal heart sounds.  Pulmonary:     Effort: Pulmonary effort is normal.     Breath sounds: Normal breath sounds. No wheezing or rales.  Abdominal:     General: Bowel sounds are normal. There is no distension.     Palpations: Abdomen is soft.     Tenderness: There is no abdominal tenderness. There is no right CVA tenderness, left CVA tenderness or guarding.  Musculoskeletal:        General: Normal  range of motion.     Cervical back: Normal range of motion and neck supple.  Skin:    General: Skin is warm.  Neurological:     General: No focal deficit present.     Mental Status: He is oriented to person, place, and time.  Psychiatric:        Mood and Affect: Mood normal.        Thought Content: Thought content normal.        Judgment: Judgment normal.     UC Treatments / Results  Labs (all labs ordered are listed, but only abnormal results are displayed) Labs Reviewed  POCT URINALYSIS DIP (MANUAL ENTRY) - Abnormal; Notable for the following components:      Result Value   Blood, UA trace-intact (*)    All other components within normal limits  COVID-19, FLU A+B NAA    EKG   Radiology No results found.  Procedures Procedures (including critical care time)  Medications Ordered in UC Medications  acetaminophen (TYLENOL) tablet 975 mg (975 mg Oral Given 12/02/21 1844)    Initial Impression / Assessment and Plan / UC Course  I have reviewed the triage vital signs and the nursing notes.  Pertinent labs & imaging results that were available during my care of the patient were reviewed by me and considered in my medical decision making (see chart for details).     Febrile and tachycardic in triage, Tylenol given at this time.  Urinalysis with trace blood, no evidence of urinary tract infection.  Exam today with no red flag findings, unclear etiology of his fever.  Possibly kidney stone causing his left flank pain, hematuria so we will treat with Flomax, fluids and discussed over-the-counter fever reducers, COVID and flu testing for further rule out, close monitoring for worsening symptoms.  Go to the emergency department if symptoms worsen at any time.  Final Clinical Impressions(s) / UC Diagnoses   Final diagnoses:  Left flank pain  Fever, unspecified  Tachycardia   Discharge Instructions   None    ED Prescriptions     Medication Sig Dispense Auth. Provider    tamsulosin (FLOMAX) 0.4 MG CAPS capsule Take 1 capsule (0.4 mg total) by mouth daily. 20 capsule Volney American, [place]  PDMP not reviewed this encounter.   Volney American, [place] 12/02/21 1932

## 2021-12-02 NOTE — ED Triage Notes (Signed)
Left sided back pain that radiates around to stomach that started today.  States he feels weak.  States pain is worse when he takes a deep breath.   States he has been coughing with light green sputum.

## 2021-12-04 LAB — COVID-19, FLU A+B NAA
Influenza A, NAA: NOT DETECTED
Influenza B, NAA: NOT DETECTED
SARS-CoV-2, NAA: NOT DETECTED

## 2022-11-05 ENCOUNTER — Ambulatory Visit
Admission: EM | Admit: 2022-11-05 | Discharge: 2022-11-05 | Disposition: A | Discharge status: Home / Self Care | Payer: Managed Care, Other (non HMO) | Attending: Physician Assistant | Admitting: Physician Assistant

## 2022-11-05 DIAGNOSIS — L237 Allergic contact dermatitis due to plants, except food: Secondary | ICD-10-CM | POA: Diagnosis not present

## 2022-11-05 MED ORDER — PREDNISONE 10 MG (21) PO TBPK: ORAL_TABLET | Freq: Every day | ORAL | 0 refills | Status: DC

## 2022-11-05 MED ORDER — METHYLPREDNISOLONE SODIUM SUCC 125 MG IJ SOLR
60.0000 mg | Freq: Once | INTRAMUSCULAR | Status: AC
  Administered 2022-11-05: 60 mg via INTRAMUSCULAR

## 2022-11-05 NOTE — ED Triage Notes (Signed)
Patient c/o rash to arms and legs that began Thursday. Patient states the rash appeared after working in the yard.   Home interventions: calamine, hydrocortisone

## 2022-11-05 NOTE — ED Provider Notes (Signed)
[facility]    CSN: [id number] Arrival date & time: 11/05/22  1202      History   Chief Complaint Chief Complaint  Patient presents with   Rash    HPI Ivan Macias is a 32 y.o. male.   Patient presents today with a 2-day history of worsening pruritic rash.  Reports symptoms began on his lower legs but have spread to involve most of his extremities.  He was weed eating out in the yard before symptoms began.  Denies any additional new exposures including to plants, insects, animals.  Denies any changes to personal hygiene products including soaps or detergents.  Denies any recent medication changes.  He has tried over-the-counter calamine lotion and hydrocortisone cream without improvement of symptoms.  Denies any associated swelling of his throat, shortness of breath, dysphagia, muffled voice.  Denies any recent steroid use.  Denies history of diabetes.  Reports symptoms are interfering with his ability perform daily activities.    History reviewed. No pertinent past medical history.  Patient Active Problem List   Diagnosis Date Noted   Hyperlipidemia 11/01/2020   Vitamin D deficiency 10/15/2020   UNSPECIFIED SITE OF ANKLE SPRAIN AND STRAIN 05/16/2010   HALLUX VALGUS 11/11/2007    History reviewed. No pertinent surgical history.     Home Medications    Prior to Admission medications   Medication Sig Start Date End Date Taking? Authorizing Provider  predniSONE (STERAPRED UNI-PAK 21 TAB) 10 MG (21) TBPK tablet Take by mouth daily. Take 6 tabs by mouth daily  for 2 days, then 5 tabs for 2 days, then 4 tabs for 2 days, then 3 tabs for 2 days, 2 tabs for 2 days, then 1 tab by mouth daily for 2 days 11/05/22  Yes Welford Christmas K, PA-C  Cholecalciferol (VITAMIN D-3) 125 MCG (5000 UT) TABS Take 1 tablet by mouth daily.    [provider]  tamsulosin (FLOMAX) 0.4 MG CAPS capsule Take 1 capsule (0.4 mg total) by mouth daily. 12/02/21   Particia Nearing, PA-C   dicyclomine (BENTYL) 20 MG tablet Take 1 tablet (20 mg total) by mouth 2 (two) times daily. 12/26/19 05/14/20  Gailen Shelter, PA  omeprazole (PRILOSEC) 20 MG capsule Take 1 capsule (20 mg total) by mouth daily for 14 days. 12/26/19 05/14/20  Gailen Shelter, PA    Family History History reviewed. No pertinent family history.  Social History Social History   Tobacco Use   Smoking status: Every Day    Packs/day: 0.50    Years: 10.00    Additional pack years: 0.00    Total pack years: 5.00    Types: Cigarettes   Smokeless tobacco: Never  Vaping Use   Vaping Use: Former  Substance Use Topics   Alcohol use: Yes    Alcohol/week: 2.0 - 3.0 standard drinks of alcohol    Types: 2 - 3 Cans of beer per week    Comment: daily   Drug use: Yes    Frequency: 1.0 times per week    Types: Marijuana     Allergies   Meloxicam and Amoxicillin   Review of Systems Review of Systems  Constitutional:  Positive for activity change. Negative for appetite change, fatigue and fever.  HENT:  Negative for sore throat, trouble swallowing and voice change.   Gastrointestinal:  Negative for abdominal pain, diarrhea, nausea and vomiting.  Musculoskeletal:  Negative for arthralgias and myalgias.  Skin:  Positive for rash. Negative for color  change and wound.     Physical Exam Triage Vital Signs ED Triage Vitals  Enc Vitals Group     BP 11/05/22 1219 134/88     Pulse Rate 11/05/22 1219 84     Resp 11/05/22 1219 18     Temp 11/05/22 1219 (!) 97.1 F (36.2 C)     Temp Source 11/05/22 1219 Oral     SpO2 11/05/22 1219 97 %     Weight --      Height --      Head Circumference --      Peak Flow --      Pain Score 11/05/22 1223 0     Pain Loc --      Pain Edu? --      Excl. in [place]? --    No data found.  Updated Vital Signs BP 134/88 (BP Location: Right Arm)   Pulse 84   Temp (!) 97.1 F (36.2 C) (Oral)   Resp 18   SpO2 97%   Visual Acuity Right Eye Distance:   Left Eye Distance:    Bilateral Distance:    Right Eye Near:   Left Eye Near:    Bilateral Near:     Physical Exam Vitals reviewed.  Constitutional:      General: He is awake.     Appearance: Normal appearance. He is well-developed. He is not ill-appearing.     Comments: Very pleasant male appears stated age in no acute distress sitting comfortably in exam room  HENT:     Head: Normocephalic and atraumatic.     Mouth/Throat:     Pharynx: No oropharyngeal exudate, posterior oropharyngeal erythema or uvula swelling.  Cardiovascular:     Rate and Rhythm: Normal rate and regular rhythm.     Heart sounds: Normal heart sounds, S1 normal and S2 normal. No murmur heard. Pulmonary:     Effort: Pulmonary effort is normal.     Breath sounds: Normal breath sounds. No stridor. No wheezing, rhonchi or rales.     Comments: Clear to auscultation bilaterally Skin:    Findings: Rash present. Rash is macular, papular and vesicular.     Comments: Widespread maculopapular rash with fine vesicles noted anterior lower legs.  Similar rash in linear presentation noted left forearm.  Neurological:     Mental Status: He is alert.  Psychiatric:        Behavior: Behavior is cooperative.      UC Treatments / Results  Labs (all labs ordered are listed, but only abnormal results are displayed) Labs Reviewed - No data to display  EKG   Radiology No results found.  Procedures Procedures (including critical care time)  Medications Ordered in UC Medications  methylPREDNISolone sodium succinate (SOLU-MEDROL) 125 mg/2 mL injection 60 mg (60 mg Intramuscular Given 11/05/22 1249)    Initial Impression / Assessment and Plan / UC Course  I have reviewed the triage vital signs and the nursing notes.  Pertinent labs & imaging results that were available during my care of the patient were reviewed by me and considered in my medical decision making (see chart for details).     Concern for poison ivy given clinical  presentation.  Patient was given 60 mg of Solu-Medrol in clinic and started on prednisone taper tomorrow (11/06/2022).  Discussed that he is not to take NSAIDs with steroids due to risk of GI bleeding.  He can use over-the-counter medications including calamine lotion as well as oral antihistamines to manage his  symptoms.  Discussed that if his symptoms continue to spread or he develops any additional symptoms such as fever, nausea, vomiting he should be seen immediately.  Strict return precautions given.  Work excuse note provided.  Final Clinical Impressions(s) / UC Diagnoses   Final diagnoses:  Allergic dermatitis due to poison ivy     Discharge Instructions      We gave you injection of steroids today.  Please start prednisone taper tomorrow (11/06/2022).  Do not take NSAIDs with this medication including aspirin, ibuprofen/Advil, naproxen/Aleve.  Keep area clean with soap and water to prevent infection.  You can use topical calamine lotion/hydrocortisone cream as needed.  I also recommend cetirizine (Zyrtec) to help with the itch.  Make sure to wash all of the clothing that could have been exposed in hot water.  If your symptoms are worsening and you have spread of rash, fever, nausea, vomiting you should be seen immediately.     ED Prescriptions     Medication Sig Dispense Auth. Provider   predniSONE (STERAPRED UNI-PAK 21 TAB) 10 MG (21) TBPK tablet Take by mouth daily. Take 6 tabs by mouth daily  for 2 days, then 5 tabs for 2 days, then 4 tabs for 2 days, then 3 tabs for 2 days, 2 tabs for 2 days, then 1 tab by mouth daily for 2 days 42 tablet Mahreen Schewe K, PA-C      PDMP not reviewed this encounter.   Jeani Hawking, PA-C 11/05/22 1306

## 2022-11-05 NOTE — Discharge Instructions (Signed)
We gave you injection of steroids today.  Please start prednisone taper tomorrow (11/06/2022).  Do not take NSAIDs with this medication including aspirin, ibuprofen/Advil, naproxen/Aleve.  Keep area clean with soap and water to prevent infection.  You can use topical calamine lotion/hydrocortisone cream as needed.  I also recommend cetirizine (Zyrtec) to help with the itch.  Make sure to wash all of the clothing that could have been exposed in hot water.  If your symptoms are worsening and you have spread of rash, fever, nausea, vomiting you should be seen immediately.

## 2023-02-02 IMAGING — DX DG CHEST 1V PORT
1 series · 1 of 1 positions shown · non-contrast
Comparison: 05/13/2020.

CLINICAL DATA: Intermittent chest pain and near syncopal episode.

EXAM:
PORTABLE CHEST 1 VIEW

[chest ap]
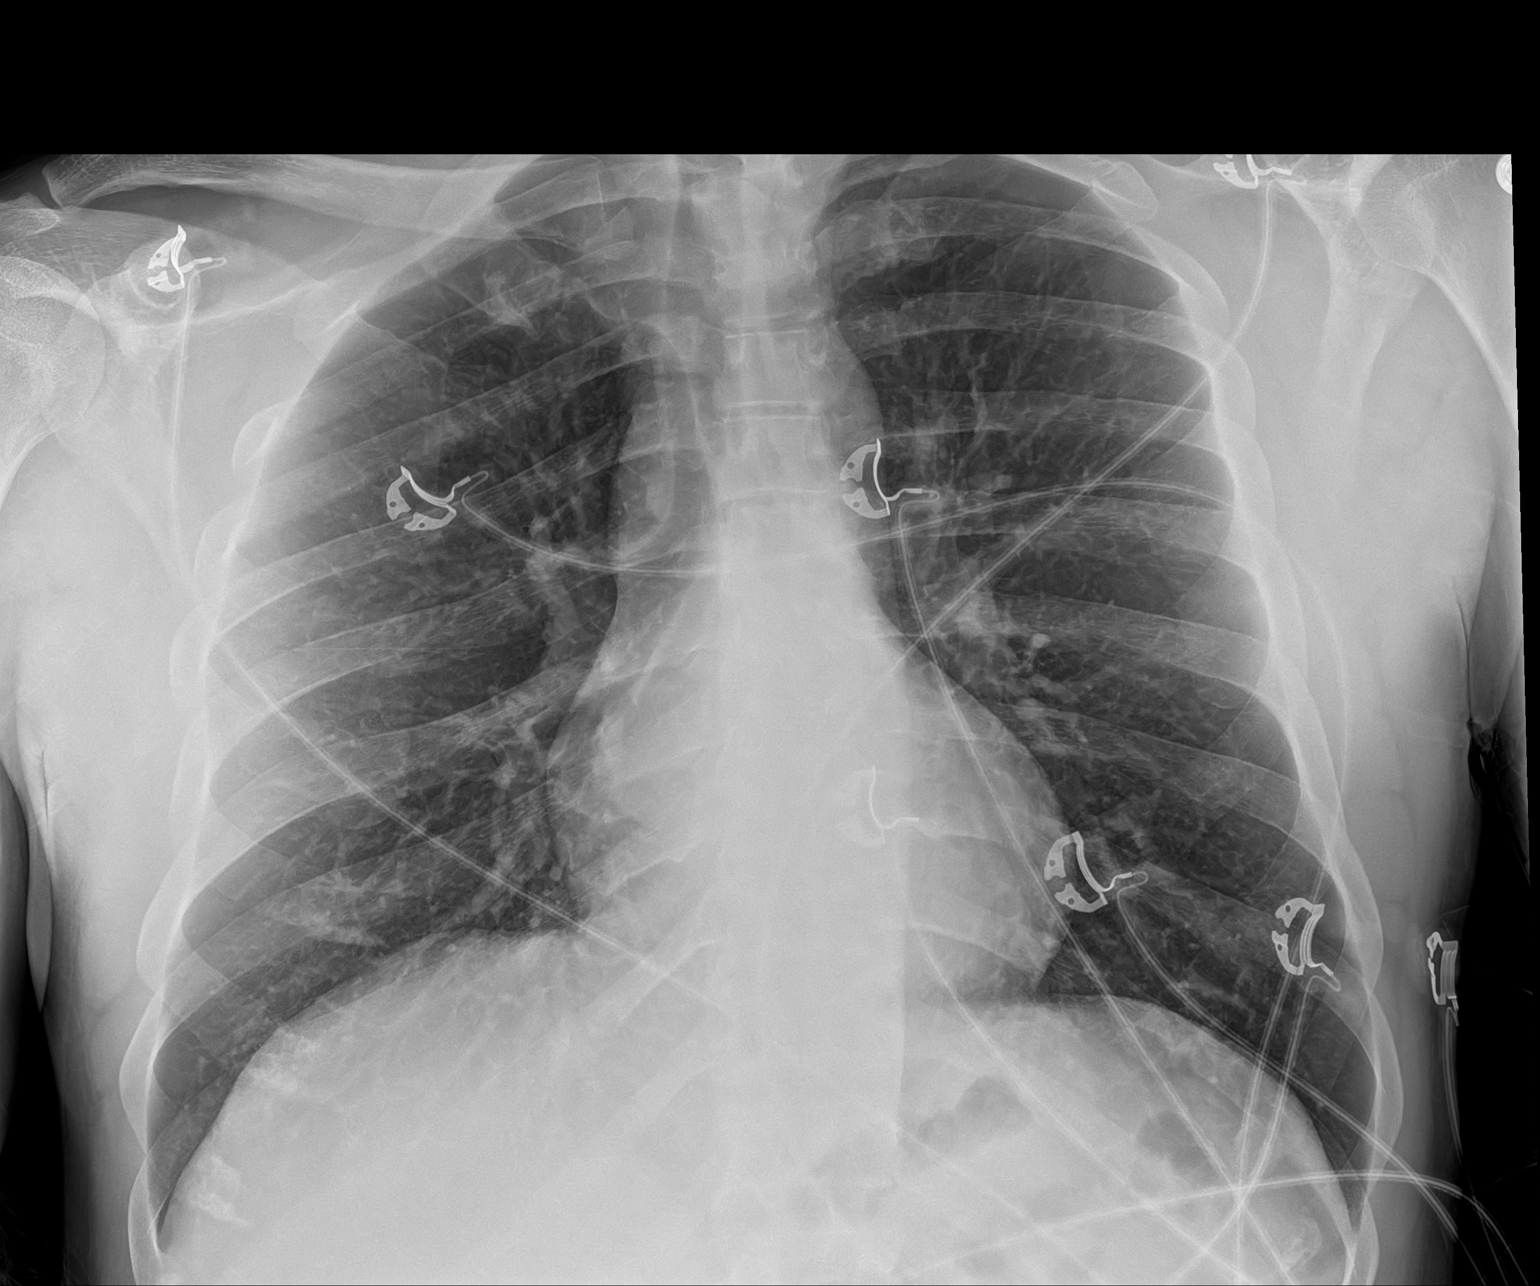

[1 of 1 positions shown; findings below may reference images not displayed]

FINDINGS: Trachea is midline. Heart size normal. Lungs are clear. No pleural
fluid.
IMPRESSION: No acute findings.

## 2023-02-05 ENCOUNTER — Emergency Department (HOSPITAL_COMMUNITY)
Admission: EM | Admit: 2023-02-05 | Discharge: 2023-02-05 | Disposition: A | Discharge status: Home / Self Care | Payer: Managed Care, Other (non HMO) | Attending: Emergency Medicine | Admitting: Emergency Medicine

## 2023-02-05 ENCOUNTER — Encounter (HOSPITAL_COMMUNITY): Payer: Self-pay | Admitting: Emergency Medicine

## 2023-02-05 ENCOUNTER — Other Ambulatory Visit: Payer: Self-pay

## 2023-02-05 DIAGNOSIS — X500XXA Overexertion from strenuous movement or load, initial encounter: Secondary | ICD-10-CM | POA: Diagnosis not present

## 2023-02-05 DIAGNOSIS — S3992XA Unspecified injury of lower back, initial encounter: Secondary | ICD-10-CM | POA: Diagnosis present

## 2023-02-05 DIAGNOSIS — S39012A Strain of muscle, fascia and tendon of lower back, initial encounter: Secondary | ICD-10-CM | POA: Diagnosis not present

## 2023-02-05 MED ORDER — METHOCARBAMOL 500 MG PO TABS
500.0000 mg | ORAL_TABLET | Freq: Once | ORAL | Status: AC
  Administered 2023-02-05: 500 mg via ORAL
  Filled 2023-02-05: qty 1

## 2023-02-05 MED ORDER — OXYCODONE-ACETAMINOPHEN 5-325 MG PO TABS
1.0000 | ORAL_TABLET | Freq: Once | ORAL | Status: AC
  Administered 2023-02-05: 1 via ORAL
  Filled 2023-02-05: qty 1

## 2023-02-05 MED ORDER — OXYCODONE-ACETAMINOPHEN 5-325 MG PO TABS: 1.0000 | ORAL_TABLET | ORAL | 0 refills | Status: DC | PRN

## 2023-02-05 MED ORDER — METHOCARBAMOL 500 MG PO TABS: 500.0000 mg | ORAL_TABLET | Freq: Three times a day (TID) | ORAL | 0 refills | Status: DC

## 2023-02-05 NOTE — ED Provider Notes (Signed)
[place] EMERGENCY DEPARTMENT AT [facility] Provider Note   CSN: [id number] Arrival date & time: 02/05/23  1121     History  Chief Complaint  Patient presents with   Back Pain    Ivan Macias is a 32 y.o. male.   Back Pain Associated symptoms: no abdominal pain, no chest pain, no fever, no numbness and no weakness        Ivan Macias is a 32 y.o. male who presents to the Emergency Department complaining of low back pain x 1 day.  States he was lifting on a trailer and felt a sharp pain of his lower back.  Pain radiates into his left leg.  Took over-the-counter pain relievers without improvement.  States he is having significant pain when he walks or stands and is having difficulty standing completely upright secondary to pain.  He denies any numbness or weakness of his legs, abdominal pain, fever or chills, urine or bowel changes.  No pain of his groin or genitalia.   Home Medications Prior to Admission medications   Medication Sig Start Date End Date Taking? Authorizing Provider  Cholecalciferol (VITAMIN D-3) 125 MCG (5000 UT) TABS Take 1 tablet by mouth daily.    [provider]  predniSONE (STERAPRED UNI-PAK 21 TAB) 10 MG (21) TBPK tablet Take by mouth daily. Take 6 tabs by mouth daily  for 2 days, then 5 tabs for 2 days, then 4 tabs for 2 days, then 3 tabs for 2 days, 2 tabs for 2 days, then 1 tab by mouth daily for 2 days 11/05/22   Raspet, Denny Peon K, PA-C  tamsulosin (FLOMAX) 0.4 MG CAPS capsule Take 1 capsule (0.4 mg total) by mouth daily. 12/02/21   Particia Nearing, PA-C  dicyclomine (BENTYL) 20 MG tablet Take 1 tablet (20 mg total) by mouth 2 (two) times daily. 12/26/19 05/14/20  Gailen Shelter, PA  omeprazole (PRILOSEC) 20 MG capsule Take 1 capsule (20 mg total) by mouth daily for 14 days. 12/26/19 05/14/20  Gailen Shelter, PA      Allergies    Meloxicam and Amoxicillin    Review of Systems   Review of Systems  Constitutional:   Negative for chills and fever.  Respiratory:  Negative for shortness of breath.   Cardiovascular:  Negative for chest pain.  Gastrointestinal:  Negative for abdominal pain, nausea, rectal pain and vomiting.  Musculoskeletal:  Positive for back pain.  Skin:  Negative for rash.  Neurological:  Negative for weakness and numbness.    Physical Exam Updated Vital Signs BP (!) 134/97   Pulse 76   Temp 98.7 F (37.1 C)   Resp 18   Ht 6' (1.829 m)   Wt 83.9 kg   SpO2 99%   BMI 25.09 kg/m  Physical Exam Vitals and nursing note reviewed.  Constitutional:      General: He is not in acute distress.    Appearance: Normal appearance. He is not ill-appearing or toxic-appearing.  Cardiovascular:     Rate and Rhythm: Normal rate and regular rhythm.     Pulses: Normal pulses.  Pulmonary:     Effort: Pulmonary effort is normal.  Abdominal:     Palpations: Abdomen is soft.     Tenderness: There is no abdominal tenderness.  Musculoskeletal:     Lumbar back: Tenderness and bony tenderness present. Normal range of motion. Positive left straight leg raise test. Negative right straight leg raise test.     Right  lower leg: No edema.     Left lower leg: No edema.     Comments: Tenderness to palpation of the lower lumbar spine and left lumbar paraspinal muscles.  There is some tenderness noted at the left SI joint space.  Has positive straight leg raise on the left.  Hip flexors and extensors are intact  Skin:    General: Skin is warm.     Capillary Refill: Capillary refill takes less than 2 seconds.     Findings: No erythema or rash.  Neurological:     General: No focal deficit present.     Mental Status: He is alert.     Sensory: No sensory deficit.     Motor: No weakness.     ED Results / Procedures / Treatments   Labs (all labs ordered are listed, but only abnormal results are displayed) Labs Reviewed - No data to display  EKG None  Radiology No results  found.  Procedures Procedures    Medications Ordered in ED Medications - No data to display  ED Course/ Medical Decision Making/ A&P                                 Medical Decision Making Patient here with low back pain after heavy lifting.  Pain worsens with certain positions, slightly improves at rest.  No improvement after over-the-counter pain relievers.  Pain radiating into left leg without saddle anesthesias numbness or weakness of the lower extremities or urine/bowel incontinence or retention  Differential includes but not limited to lumbar strain, sciatica, cauda equina, herniated disc.  I suspect lumbar strain as he does not have exam findings consistent with cauda equina  He is ambulatory with slow but steady gait.  I do not appreciate any focal neurodeficits  Amount and/or Complexity of Data Reviewed Discussion of management or test interpretation with external provider(s): No red flags to suggest cauda equina on exam.  I suspect lumbar strain.  No history of fall or trauma suggesting need for emergent imaging at this time.  Patient agreeable to symptomatic treatment and close outpatient follow-up.  Return precautions were also given.  Risk Prescription drug management.           Final Clinical Impression(s) / ED Diagnoses Final diagnoses:  None    Rx / DC Orders ED Discharge Orders     None         Pauline Aus, PA-C 02/07/23 1741    Rondel Baton, MD 02/08/23 1356

## 2023-02-05 NOTE — Discharge Instructions (Signed)
Continue to alternate ice and heat to your lower back.  Take your ibuprofen, 800 mg 3 times a day with food.  Avoid twisting bending or lifting greater than 5 to 10 pounds for 1 week.  Follow-up with your primary care provider for recheck return to the emergency department for any new or worsening symptoms.

## 2023-02-05 NOTE — ED Triage Notes (Signed)
Per pt he was lifting and trailer yesterday and stood up to fast, currently having lower back pain, last took Motrin and Tylenol, no meds today.

## 2023-09-30 ENCOUNTER — Encounter (HOSPITAL_COMMUNITY): Payer: Self-pay

## 2023-09-30 ENCOUNTER — Emergency Department (HOSPITAL_COMMUNITY)

## 2023-09-30 ENCOUNTER — Emergency Department (HOSPITAL_COMMUNITY)
Admission: EM | Admit: 2023-09-30 | Discharge: 2023-09-30 | Disposition: A | Discharge status: Home / Self Care | Attending: Emergency Medicine | Admitting: Emergency Medicine

## 2023-09-30 ENCOUNTER — Other Ambulatory Visit: Payer: Self-pay

## 2023-09-30 DIAGNOSIS — S199XXA Unspecified injury of neck, initial encounter: Secondary | ICD-10-CM | POA: Diagnosis present

## 2023-09-30 DIAGNOSIS — S12501A Unspecified nondisplaced fracture of sixth cervical vertebra, initial encounter for closed fracture: Secondary | ICD-10-CM | POA: Diagnosis not present

## 2023-09-30 DIAGNOSIS — S83412A Sprain of medial collateral ligament of left knee, initial encounter: Secondary | ICD-10-CM | POA: Diagnosis not present

## 2023-09-30 DIAGNOSIS — R519 Headache, unspecified: Secondary | ICD-10-CM | POA: Insufficient documentation

## 2023-09-30 DIAGNOSIS — R040 Epistaxis: Secondary | ICD-10-CM | POA: Diagnosis not present

## 2023-09-30 DIAGNOSIS — Z23 Encounter for immunization: Secondary | ICD-10-CM | POA: Insufficient documentation

## 2023-09-30 DIAGNOSIS — Y92410 Unspecified street and highway as the place of occurrence of the external cause: Secondary | ICD-10-CM | POA: Diagnosis not present

## 2023-09-30 DIAGNOSIS — S61411A Laceration without foreign body of right hand, initial encounter: Secondary | ICD-10-CM | POA: Insufficient documentation

## 2023-09-30 DIAGNOSIS — F1090 Alcohol use, unspecified, uncomplicated: Secondary | ICD-10-CM | POA: Insufficient documentation

## 2023-09-30 DIAGNOSIS — R000 Tachycardia, unspecified: Secondary | ICD-10-CM | POA: Diagnosis not present

## 2023-09-30 DIAGNOSIS — S3991XA Unspecified injury of abdomen, initial encounter: Secondary | ICD-10-CM | POA: Insufficient documentation

## 2023-09-30 DIAGNOSIS — Y908 Blood alcohol level of 240 mg/100 ml or more: Secondary | ICD-10-CM | POA: Insufficient documentation

## 2023-09-30 DIAGNOSIS — Z79899 Other long term (current) drug therapy: Secondary | ICD-10-CM | POA: Diagnosis not present

## 2023-09-30 DIAGNOSIS — S01321A Laceration with foreign body of right ear, initial encounter: Secondary | ICD-10-CM | POA: Diagnosis not present

## 2023-09-30 DIAGNOSIS — M7989 Other specified soft tissue disorders: Secondary | ICD-10-CM | POA: Insufficient documentation

## 2023-09-30 DIAGNOSIS — T148XXA Other injury of unspecified body region, initial encounter: Secondary | ICD-10-CM

## 2023-09-30 DIAGNOSIS — S01311A Laceration without foreign body of right ear, initial encounter: Secondary | ICD-10-CM

## 2023-09-30 LAB — I-STAT CHEM 8, ED
BUN: 5 mg/dL — ABNORMAL LOW (ref 6–20)
Calcium, Ion: 0.94 mmol/L — ABNORMAL LOW (ref 1.15–1.40)
Chloride: 105 mmol/L (ref 98–111)
Creatinine, Ser: 1.4 mg/dL — ABNORMAL HIGH (ref 0.61–1.24)
Glucose, Bld: 102 mg/dL — ABNORMAL HIGH (ref 70–99)
HCT: 45 % (ref 39.0–52.0)
Hemoglobin: 15.3 g/dL (ref 13.0–17.0)
Potassium: 3.8 mmol/L (ref 3.5–5.1)
Sodium: 138 mmol/L (ref 135–145)
TCO2: 24 mmol/L (ref 22–32)

## 2023-09-30 LAB — SAMPLE TO BLOOD BANK

## 2023-09-30 LAB — COMPREHENSIVE METABOLIC PANEL WITH GFR
ALT: 78 U/L — ABNORMAL HIGH (ref 0–44)
AST: 98 U/L — ABNORMAL HIGH (ref 15–41)
Albumin: 4.2 g/dL (ref 3.5–5.0)
Alkaline Phosphatase: 58 U/L (ref 38–126)
Anion gap: 10 (ref 5–15)
BUN: 5 mg/dL — ABNORMAL LOW (ref 6–20)
CO2: 25 mmol/L (ref 22–32)
Calcium: 8.7 mg/dL — ABNORMAL LOW (ref 8.9–10.3)
Chloride: 105 mmol/L (ref 98–111)
Creatinine, Ser: 1.23 mg/dL (ref 0.61–1.24)
GFR, Estimated: >60 mL/min (ref >60)
Glucose, Bld: 107 mg/dL — ABNORMAL HIGH (ref 70–99)
Potassium: 3.6 mmol/L (ref 3.5–5.1)
Sodium: 140 mmol/L (ref 135–145)
Total Bilirubin: 0.6 mg/dL (ref 0.0–1.2)
Total Protein: 7.2 g/dL (ref 6.5–8.1)

## 2023-09-30 LAB — URINALYSIS, ROUTINE W REFLEX MICROSCOPIC
Bacteria, UA: NONE SEEN
Bilirubin Urine: NEGATIVE
Glucose, UA: NEGATIVE mg/dL
Ketones, ur: NEGATIVE mg/dL
Leukocytes,Ua: NEGATIVE
Nitrite: NEGATIVE
Protein, ur: NEGATIVE mg/dL
Specific Gravity, Urine: 1.005 (ref 1.005–1.030)
pH: 5 (ref 5.0–8.0)

## 2023-09-30 LAB — PROTIME-INR
INR: 0.9 (ref 0.8–1.2)
Prothrombin Time: 12.4 s (ref 11.4–15.2)

## 2023-09-30 LAB — RAPID URINE DRUG SCREEN, HOSP PERFORMED
Amphetamines: NOT DETECTED
Barbiturates: NOT DETECTED
Benzodiazepines: NOT DETECTED
Cocaine: NOT DETECTED
Opiates: NOT DETECTED
Tetrahydrocannabinol: POSITIVE — AB

## 2023-09-30 LAB — I-STAT CG4 LACTIC ACID, ED: Lactic Acid, Venous: 2.8 mmol/L (ref 0.5–1.9)

## 2023-09-30 LAB — CBC
HCT: 42.3 % (ref 39.0–52.0)
Hemoglobin: 14.1 g/dL (ref 13.0–17.0)
MCH: 29 pg (ref 26.0–34.0)
MCHC: 33.3 g/dL (ref 30.0–36.0)
MCV: 86.9 fL (ref 80.0–100.0)
Platelets: 226 10*3/uL (ref 150–400)
RBC: 4.87 MIL/uL (ref 4.22–5.81)
RDW: 15 % (ref 11.5–15.5)
WBC: 10.8 10*3/uL — ABNORMAL HIGH (ref 4.0–10.5)
nRBC: 0 % (ref 0.0–0.2)

## 2023-09-30 LAB — ETHANOL: Alcohol, Ethyl (B): 267 mg/dL — ABNORMAL HIGH (ref <10)

## 2023-09-30 MED ORDER — FENTANYL CITRATE PF 50 MCG/ML IJ SOSY
50.0000 ug | PREFILLED_SYRINGE | Freq: Once | INTRAMUSCULAR | Status: AC
  Administered 2023-09-30: 50 ug via INTRAVENOUS
  Filled 2023-09-30: qty 1

## 2023-09-30 MED ORDER — CEFADROXIL 500 MG PO CAPS: 500.0000 mg | ORAL_CAPSULE | Freq: Two times a day (BID) | ORAL | 0 refills | Status: AC

## 2023-09-30 MED ORDER — FENTANYL CITRATE PF 50 MCG/ML IJ SOSY
25.0000 ug | PREFILLED_SYRINGE | Freq: Once | INTRAMUSCULAR | Status: AC
  Administered 2023-09-30: 25 ug via INTRAVENOUS
  Filled 2023-09-30: qty 1

## 2023-09-30 MED ORDER — HYDROCODONE-ACETAMINOPHEN 5-325 MG PO TABS: 1.0000 | ORAL_TABLET | ORAL | 0 refills | Status: DC | PRN

## 2023-09-30 MED ORDER — CEFAZOLIN SODIUM-DEXTROSE 2-4 GM/100ML-% IV SOLN
2.0000 g | Freq: Once | INTRAVENOUS | Status: AC
  Administered 2023-09-30: 2 g via INTRAVENOUS
  Filled 2023-09-30: qty 100

## 2023-09-30 MED ORDER — BACITRACIN ZINC 500 UNIT/GM EX OINT: TOPICAL_OINTMENT | Freq: Two times a day (BID) | CUTANEOUS | Status: DC

## 2023-09-30 MED ORDER — HYDROCODONE-ACETAMINOPHEN 5-325 MG PO TABS
1.0000 | ORAL_TABLET | Freq: Once | ORAL | Status: AC
  Administered 2023-09-30: 1 via ORAL
  Filled 2023-09-30: qty 1

## 2023-09-30 MED ORDER — TETANUS-DIPHTH-ACELL PERTUSSIS 5-2.5-18.5 LF-MCG/0.5 IM SUSY
0.5000 mL | PREFILLED_SYRINGE | Freq: Once | INTRAMUSCULAR | Status: AC
  Administered 2023-09-30: 0.5 mL via INTRAMUSCULAR
  Filled 2023-09-30: qty 0.5

## 2023-09-30 MED ORDER — IOHEXOL 350 MG/ML SOLN
75.0000 mL | Freq: Once | INTRAVENOUS | Status: AC | PRN
  Administered 2023-09-30: 75 mL via INTRAVENOUS
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
## 2023-09-30 NOTE — ED Notes (Signed)
 Neuro surgery at bedside.
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
## 2023-09-30 NOTE — ED Notes (Signed)
[facility] at bedside 

## 2023-09-30 NOTE — ED Notes (Signed)
 Pt placed on 2L [place] after decrease in sats after administration of pain medication fentanyl, provider is aware.

## 2023-09-30 NOTE — ED Notes (Signed)
 C-collar placed by EMS PTA for spinal immobilization.

## 2023-09-30 NOTE — ED Notes (Signed)
 C-Collar replaced from EMS hard collar to [employer] for comfort by provider and NT while maintaining spinal immobilization

## 2023-09-30 NOTE — ED Notes (Signed)
 Patient transported to CT

## 2023-09-30 NOTE — Discharge Instructions (Addendum)
 Based on the events which brought you to the ER today, it is possible that you may have a concussion. A concussion occurs when there is a blow to the head or body, with enough force to shake the brain and disrupt how the brain functions. You may experience symptoms such as headaches, sensitivity to light/noise, dizziness, cognitive slowing, difficulty concentrating / remembering, trouble sleeping and drowsiness. These symptoms may last anywhere from hours/days to potentially weeks/months. While these symptoms are very frustrating and perhaps debilitating, it is important that you remember that they will improve over time. Everyone has a different rate of recovery; it is difficult to predict when your symptoms will resolve. In order to allow for your brain to heal after the injury, we recommend that you see your primary physician or a physician knowledgeable in concussion management. We also advise you to let your body and brain rest: avoid physical activities (sports, gym, and exercise) and reduce cognitive demands (reading, texting, TV watching, computer use, video games, etc). School attendance, after-school activities and work may need to be modified to avoid increasing symptoms. We recommend against driving until until all symptoms have resolved. Come back to the ER right away if you are having repeated episodes of vomiting, severe/worsening headache/dizziness or any other symptom that alarms you. We recommended that someone stay with you for the next 24 hours to monitor for these worrisome symptoms.  Please wear your cervical collar for the next 6 weeks, follow up with neurosurgery on the office  Apply bacitracin ointment to wounds to your face twice daily. Use gently soap and water to cleanse wounds twice daily  Please follow up with orthopedics regarding knee    It was a pleasure caring for you today in the emergency department.  Please return to the emergency department for any worsening or  worrisome symptoms.
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
## 2023-09-30 NOTE — ED Notes (Signed)
 [facility] at the bedside, pt consented verbally for breathalyzer from [facility], pt blew 0.26 as witnssed by this RN

## 2023-09-30 NOTE — ED Notes (Signed)
 Pt return from CT scanner, NAD noted, A&O x4. Family remains at bedside

## 2023-09-30 NOTE — Progress Notes (Signed)
 Orthopedic Tech Progress Note Patient Details:  Ivan Macias [birth year] [id number]  Ortho Devices Type of Ortho Device: Knee Immobilizer, Crutches Ortho Device/Splint Location: LLE, crutches adjusted/at bedside Ortho Device/Splint Interventions: Ordered, Application, Adjustment   Post Interventions Patient Tolerated: Fair Instructions Provided: Care of device, Adjustment of device, Poper ambulation with device  Docia Furl 09/30/2023, 10:41 AM

## 2023-09-30 NOTE — ED Notes (Signed)
 Complete rainbow of labs for blood draw if trauma panel order by provider sent to lab

## 2023-10-02 ENCOUNTER — Other Ambulatory Visit: Payer: Self-pay

## 2023-10-02 ENCOUNTER — Ambulatory Visit: Payer: Self-pay

## 2023-10-02 ENCOUNTER — Emergency Department (HOSPITAL_COMMUNITY)
Admission: EM | Admit: 2023-10-02 | Discharge: 2023-10-02 | Discharge status: Against Medical Advice | Attending: Emergency Medicine | Admitting: Emergency Medicine

## 2023-10-02 ENCOUNTER — Encounter (HOSPITAL_COMMUNITY): Payer: Self-pay

## 2023-10-02 DIAGNOSIS — R200 Anesthesia of skin: Secondary | ICD-10-CM | POA: Insufficient documentation

## 2023-10-02 DIAGNOSIS — R202 Paresthesia of skin: Secondary | ICD-10-CM | POA: Diagnosis present

## 2023-10-02 DIAGNOSIS — Z5321 Procedure and treatment not carried out due to patient leaving prior to being seen by health care provider: Secondary | ICD-10-CM | POA: Diagnosis not present
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
## 2023-10-02 NOTE — ED Notes (Signed)
 Pt stated he does not want to sit any longer. Pt's family stated he will try again tomorrow. Pt seen leaving the ED
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
## 2023-10-05 ENCOUNTER — Emergency Department (HOSPITAL_BASED_OUTPATIENT_CLINIC_OR_DEPARTMENT_OTHER): Admitting: Radiology

## 2023-10-05 ENCOUNTER — Emergency Department (HOSPITAL_BASED_OUTPATIENT_CLINIC_OR_DEPARTMENT_OTHER)

## 2023-10-05 ENCOUNTER — Encounter (HOSPITAL_BASED_OUTPATIENT_CLINIC_OR_DEPARTMENT_OTHER): Payer: Self-pay

## 2023-10-05 ENCOUNTER — Other Ambulatory Visit: Payer: Self-pay

## 2023-10-05 ENCOUNTER — Emergency Department (HOSPITAL_BASED_OUTPATIENT_CLINIC_OR_DEPARTMENT_OTHER): Admission: EM | Admit: 2023-10-05 | Discharge: 2023-10-05 | Disposition: A | Discharge status: Home / Self Care

## 2023-10-05 DIAGNOSIS — M79662 Pain in left lower leg: Secondary | ICD-10-CM | POA: Insufficient documentation

## 2023-10-05 DIAGNOSIS — R2242 Localized swelling, mass and lump, left lower limb: Secondary | ICD-10-CM | POA: Insufficient documentation

## 2023-10-05 DIAGNOSIS — M79605 Pain in left leg: Secondary | ICD-10-CM

## 2023-10-05 LAB — BASIC METABOLIC PANEL WITH GFR
Anion gap: 9 (ref 5–15)
BUN: 12 mg/dL (ref 6–20)
CO2: 27 mmol/L (ref 22–32)
Calcium: 9.7 mg/dL (ref 8.9–10.3)
Chloride: 100 mmol/L (ref 98–111)
Creatinine, Ser: 1.09 mg/dL (ref 0.61–1.24)
GFR, Estimated: >60 mL/min (ref >60)
Glucose, Bld: 102 mg/dL — ABNORMAL HIGH (ref 70–99)
Potassium: 3.6 mmol/L (ref 3.5–5.1)
Sodium: 136 mmol/L (ref 135–145)

## 2023-10-05 LAB — CBC
HCT: 42.8 % (ref 39.0–52.0)
Hemoglobin: 14.1 g/dL (ref 13.0–17.0)
MCH: 28.8 pg (ref 26.0–34.0)
MCHC: 32.9 g/dL (ref 30.0–36.0)
MCV: 87.3 fL (ref 80.0–100.0)
Platelets: 292 10*3/uL (ref 150–400)
RBC: 4.9 MIL/uL (ref 4.22–5.81)
RDW: 14 % (ref 11.5–15.5)
WBC: 12.9 10*3/uL — ABNORMAL HIGH (ref 4.0–10.5)
nRBC: 0 % (ref 0.0–0.2)

## 2023-10-05 NOTE — Discharge Instructions (Addendum)
 Evaluation today was overall reassuring.  Findings on exam today are consistent with a contusion or bruise.  Please follow-up with your PCP.  Ultrasound was negative for DVT.  X-ray was also negative.  Recommend conservative treatment which includes applying ice 3-4 times a day, also recommend ibuprofen and you can also take Tylenol as well.
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
## 2023-10-05 NOTE — ED Notes (Signed)
 Patient transported to Ultrasound

## 2023-10-09 ENCOUNTER — Ambulatory Visit

## 2023-10-09 VITALS — BP 134/83 | HR 80 | Ht 72.0 in | Wt 185.1 lb

## 2023-10-09 DIAGNOSIS — S01311D Laceration without foreign body of right ear, subsequent encounter: Secondary | ICD-10-CM

## 2023-10-09 DIAGNOSIS — S12501D Unspecified nondisplaced fracture of sixth cervical vertebra, subsequent encounter for fracture with routine healing: Secondary | ICD-10-CM | POA: Diagnosis not present

## 2023-10-09 MED ORDER — HYDROCODONE-ACETAMINOPHEN 5-325 MG PO TABS: 1.0000 | ORAL_TABLET | Freq: Four times a day (QID) | ORAL | 0 refills | Status: DC | PRN
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
## 2023-10-10 DIAGNOSIS — S01311A Laceration without foreign body of right ear, initial encounter: Secondary | ICD-10-CM | POA: Insufficient documentation

## 2023-10-10 DIAGNOSIS — S12501A Unspecified nondisplaced fracture of sixth cervical vertebra, initial encounter for closed fracture: Secondary | ICD-10-CM | POA: Insufficient documentation

## 2023-10-10 NOTE — Assessment & Plan Note (Signed)
Referral placed to dermatology for further evaluation and treatment.

## 2023-10-10 NOTE — Assessment & Plan Note (Signed)
 Pt wearing C-collar and has f/u with neurosurgeon scheduled

## 2023-10-11 ENCOUNTER — Other Ambulatory Visit: Payer: Self-pay

## 2023-10-11 DIAGNOSIS — S0003XD Contusion of scalp, subsequent encounter: Secondary | ICD-10-CM | POA: Diagnosis not present

## 2023-10-11 DIAGNOSIS — S0081XD Abrasion of other part of head, subsequent encounter: Secondary | ICD-10-CM | POA: Diagnosis not present

## 2023-10-11 DIAGNOSIS — Y92410 Unspecified street and highway as the place of occurrence of the external cause: Secondary | ICD-10-CM | POA: Diagnosis not present

## 2023-10-11 DIAGNOSIS — S0990XD Unspecified injury of head, subsequent encounter: Secondary | ICD-10-CM | POA: Diagnosis present

## 2023-10-12 ENCOUNTER — Emergency Department (HOSPITAL_BASED_OUTPATIENT_CLINIC_OR_DEPARTMENT_OTHER)
Admission: EM | Admit: 2023-10-12 | Discharge: 2023-10-12 | Disposition: A | Discharge status: Home / Self Care | Attending: Emergency Medicine | Admitting: Emergency Medicine

## 2023-10-12 ENCOUNTER — Encounter (HOSPITAL_BASED_OUTPATIENT_CLINIC_OR_DEPARTMENT_OTHER): Payer: Self-pay | Admitting: Emergency Medicine

## 2023-10-12 ENCOUNTER — Other Ambulatory Visit: Payer: Self-pay

## 2023-10-12 ENCOUNTER — Emergency Department (HOSPITAL_BASED_OUTPATIENT_CLINIC_OR_DEPARTMENT_OTHER)

## 2023-10-12 DIAGNOSIS — S12501D Unspecified nondisplaced fracture of sixth cervical vertebra, subsequent encounter for fracture with routine healing: Secondary | ICD-10-CM

## 2023-10-12 DIAGNOSIS — R519 Headache, unspecified: Secondary | ICD-10-CM

## 2023-10-12 DIAGNOSIS — S0003XD Contusion of scalp, subsequent encounter: Secondary | ICD-10-CM

## 2023-10-12 MED ORDER — OXYCODONE-ACETAMINOPHEN 5-325 MG PO TABS
1.0000 | ORAL_TABLET | Freq: Once | ORAL | Status: AC
  Administered 2023-10-12: 1 via ORAL
  Filled 2023-10-12: qty 1

## 2023-10-12 NOTE — ED Notes (Signed)
 ED Provider at bedside.
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
## 2023-10-12 NOTE — Discharge Instructions (Signed)
 You were seen today for ongoing headache.  Continue your hydrocodone at home.  Apply ice to the hematoma to help involute.  Your CT scan does not show any new traumatic injury.

## 2023-10-15 ENCOUNTER — Telehealth: Payer: Self-pay

## 2023-10-15 NOTE — Telephone Encounter (Signed)
 Copied from CRM [id number]. Topic: Clinical - Prescription Issue >> Oct 12, 2023  3:35 PM Sasha H wrote: Reason for CRM: pt states [facility] needs more verification for HYDROcodone-acetaminophen (NORCO/VICODIN) 5-325 MG tablet

## 2023-10-16 ENCOUNTER — Telehealth: Payer: Self-pay

## 2023-10-16 NOTE — Telephone Encounter (Signed)
 Copied from CRM [id number]. Topic: General - Call Back - No Documentation >> Oct 16, 2023 10:14 AM Alysia Jumbo S wrote: Reason for CRM: Att:"Rachel"-Patient returning missed phone call, states he received a voicemail asking to callback, no additional details provided.

## 2023-10-17 ENCOUNTER — Other Ambulatory Visit: Payer: Self-pay

## 2023-10-17 ENCOUNTER — Telehealth: Payer: Self-pay

## 2023-10-17 DIAGNOSIS — S12501D Unspecified nondisplaced fracture of sixth cervical vertebra, subsequent encounter for fracture with routine healing: Secondary | ICD-10-CM

## 2023-10-17 MED ORDER — HYDROCODONE-ACETAMINOPHEN 5-325 MG PO TABS: 1.0000 | ORAL_TABLET | Freq: Four times a day (QID) | ORAL | 0 refills | Status: DC | PRN

## 2023-10-17 NOTE — Telephone Encounter (Signed)
 Copied from CRM [id number]. Topic: Clinical - Prescription Issue >> Oct 17, 2023 10:59 AM Phil Braun wrote: Reason for CRM:   HYDROcodone-acetaminophen (NORCO/VICODIN) 5-325 MG tablet  It needs to be sent electronically. It was faxed but narcotics can not be faxed, per pharmacy.

## 2023-10-18 ENCOUNTER — Other Ambulatory Visit: Payer: Self-pay

## 2023-10-18 DIAGNOSIS — S12501D Unspecified nondisplaced fracture of sixth cervical vertebra, subsequent encounter for fracture with routine healing: Secondary | ICD-10-CM

## 2023-10-18 MED ORDER — HYDROCODONE-ACETAMINOPHEN 5-325 MG PO TABS: 1.0000 | ORAL_TABLET | Freq: Four times a day (QID) | ORAL | 0 refills | Status: DC | PRN

## 2023-10-18 NOTE — Telephone Encounter (Signed)
 Pt advised he said it was fine that it was sent there and will pick it up from there

## 2023-10-19 ENCOUNTER — Other Ambulatory Visit: Payer: Self-pay | Admitting: Internal Medicine

## 2023-10-19 ENCOUNTER — Telehealth: Payer: Self-pay

## 2023-10-19 ENCOUNTER — Other Ambulatory Visit: Payer: Self-pay

## 2023-10-19 DIAGNOSIS — S12501D Unspecified nondisplaced fracture of sixth cervical vertebra, subsequent encounter for fracture with routine healing: Secondary | ICD-10-CM

## 2023-10-19 MED ORDER — HYDROCODONE-ACETAMINOPHEN 5-325 MG PO TABS: 1.0000 | ORAL_TABLET | Freq: Four times a day (QID) | ORAL | 0 refills | Status: AC | PRN

## 2023-10-19 MED ORDER — HYDROCODONE-ACETAMINOPHEN 5-325 MG PO TABS: 1.0000 | ORAL_TABLET | Freq: Four times a day (QID) | ORAL | 0 refills | Status: DC | PRN

## 2023-10-19 NOTE — Telephone Encounter (Signed)
 Copied from CRM [id number]. Topic: Clinical - Prescription Issue >> Oct 19, 2023  9:54 AM Everette C wrote: Reason for CRM: The patient shares that they've been unable to receive their prescription for HYDROcodone -acetaminophen  (NORCO/VICODIN) 5-325 MG tablet [[id number]] due to issues with their PCP's DEA number (according to their pharmacy)   The patient would like to please be contacted by a member of practice staff to provide clarity on the issue when possible.

## 2023-10-19 NOTE — Telephone Encounter (Signed)
 Talked to Pt. Will give him a call when its figured out

## 2023-10-19 NOTE — Telephone Encounter (Signed)
 The pharmacy is saying that the prescribers DEA is invalid. The office manager is looking into the cause of this but in the meantime, Ivan Macias is asking if you would mind sending the hydrocodone  to [facility] in [place] so he is able to pick it up today. Pls advise

## 2024-01-08 ENCOUNTER — Encounter
